# Patient Record
Sex: Male | Born: 1990
Health system: Southern US, Community
[De-identification: ages and names within clinical notes are randomized; demographics above are authoritative.]

## PROBLEM LIST (undated history)

## (undated) ENCOUNTER — Emergency Department (HOSPITAL_COMMUNITY): Payer: Medicaid Other

## (undated) DIAGNOSIS — E119 Type 2 diabetes mellitus without complications: Secondary | ICD-10-CM

## (undated) HISTORY — PX: EYE SURGERY: SHX253

## (undated) HISTORY — PX: OTHER SURGICAL HISTORY: SHX169

## (undated) HISTORY — PX: TENDON REPAIR: SHX5111

---

## 2018-12-10 ENCOUNTER — Other Ambulatory Visit: Payer: Self-pay

## 2018-12-10 ENCOUNTER — Inpatient Hospital Stay (HOSPITAL_COMMUNITY)
Admission: EM | Admit: 2018-12-10 | Discharge: 2018-12-12 | DRG: 638 | Disposition: A | Discharge status: Home / Self Care | Payer: Medicaid Other | Attending: Internal Medicine | Admitting: Internal Medicine

## 2018-12-10 ENCOUNTER — Encounter (HOSPITAL_COMMUNITY): Payer: Self-pay | Admitting: Emergency Medicine

## 2018-12-10 ENCOUNTER — Emergency Department (HOSPITAL_COMMUNITY)
Admission: EM | Admit: 2018-12-10 | Discharge: 2018-12-10 | Discharge status: Against Medical Advice | Payer: Self-pay | Attending: Emergency Medicine | Admitting: Emergency Medicine

## 2018-12-10 ENCOUNTER — Encounter (HOSPITAL_COMMUNITY): Payer: Self-pay | Admitting: Student

## 2018-12-10 DIAGNOSIS — E1110 Type 2 diabetes mellitus with ketoacidosis without coma: Secondary | ICD-10-CM | POA: Diagnosis present

## 2018-12-10 DIAGNOSIS — E860 Dehydration: Secondary | ICD-10-CM | POA: Diagnosis present

## 2018-12-10 DIAGNOSIS — F1010 Alcohol abuse, uncomplicated: Secondary | ICD-10-CM | POA: Diagnosis present

## 2018-12-10 DIAGNOSIS — Z87891 Personal history of nicotine dependence: Secondary | ICD-10-CM

## 2018-12-10 DIAGNOSIS — Z5321 Procedure and treatment not carried out due to patient leaving prior to being seen by health care provider: Secondary | ICD-10-CM | POA: Insufficient documentation

## 2018-12-10 DIAGNOSIS — N179 Acute kidney failure, unspecified: Secondary | ICD-10-CM | POA: Diagnosis present

## 2018-12-10 DIAGNOSIS — R112 Nausea with vomiting, unspecified: Secondary | ICD-10-CM | POA: Diagnosis present

## 2018-12-10 DIAGNOSIS — R7989 Other specified abnormal findings of blood chemistry: Secondary | ICD-10-CM | POA: Diagnosis present

## 2018-12-10 DIAGNOSIS — E871 Hypo-osmolality and hyponatremia: Secondary | ICD-10-CM | POA: Diagnosis present

## 2018-12-10 DIAGNOSIS — E876 Hypokalemia: Secondary | ICD-10-CM | POA: Diagnosis present

## 2018-12-10 DIAGNOSIS — F1290 Cannabis use, unspecified, uncomplicated: Secondary | ICD-10-CM | POA: Diagnosis present

## 2018-12-10 DIAGNOSIS — I959 Hypotension, unspecified: Secondary | ICD-10-CM | POA: Diagnosis present

## 2018-12-10 DIAGNOSIS — Z20828 Contact with and (suspected) exposure to other viral communicable diseases: Secondary | ICD-10-CM | POA: Diagnosis present

## 2018-12-10 DIAGNOSIS — R109 Unspecified abdominal pain: Secondary | ICD-10-CM

## 2018-12-10 DIAGNOSIS — E1010 Type 1 diabetes mellitus with ketoacidosis without coma: Secondary | ICD-10-CM

## 2018-12-10 DIAGNOSIS — Z560 Unemployment, unspecified: Secondary | ICD-10-CM

## 2018-12-10 LAB — CBC
HCT: 41.2 % (ref 39.0–52.0)
HCT: 41.1 % (ref 39.0–52.0)
Hemoglobin: 14.5 g/dL (ref 13.0–17.0)
Hemoglobin: 14.7 g/dL (ref 13.0–17.0)
MCH: 34.1 pg — ABNORMAL HIGH (ref 26.0–34.0)
MCH: 36.2 pg — ABNORMAL HIGH (ref 26.0–34.0)
MCHC: 35.2 g/dL (ref 30.0–36.0)
MCHC: 35.8 g/dL (ref 30.0–36.0)
MCV: 96.9 fL (ref 80.0–100.0)
MCV: 101.2 fL — ABNORMAL HIGH (ref 80.0–100.0)
Platelets: 377 10*3/uL (ref 150–400)
Platelets: 378 10*3/uL (ref 150–400)
RBC: 4.25 MIL/uL (ref 4.22–5.81)
RBC: 4.06 MIL/uL — ABNORMAL LOW (ref 4.22–5.81)
RDW: 11.9 % (ref 11.5–15.5)
RDW: 12 % (ref 11.5–15.5)
WBC: 7.5 10*3/uL (ref 4.0–10.5)
WBC: 6.4 10*3/uL (ref 4.0–10.5)
nRBC: 0 % (ref 0.0–0.2)
nRBC: 0 % (ref 0.0–0.2)

## 2018-12-10 LAB — COMPREHENSIVE METABOLIC PANEL
ALT: 53 U/L — ABNORMAL HIGH (ref 0–44)
AST: 54 U/L — ABNORMAL HIGH (ref 15–41)
Albumin: 3.8 g/dL (ref 3.5–5.0)
Alkaline Phosphatase: 175 U/L — ABNORMAL HIGH (ref 38–126)
Anion gap: 19 — ABNORMAL HIGH (ref 5–15)
BUN: 14 mg/dL (ref 6–20)
CO2: 22 mmol/L (ref 22–32)
Calcium: 8.9 mg/dL (ref 8.9–10.3)
Chloride: 81 mmol/L — ABNORMAL LOW (ref 98–111)
Creatinine, Ser: 1.25 mg/dL — ABNORMAL HIGH (ref 0.61–1.24)
GFR calc Af Amer: >60 mL/min (ref >60)
GFR calc non Af Amer: >60 mL/min (ref >60)
Glucose, Bld: 844 mg/dL (ref 70–99)
Potassium: 4.3 mmol/L (ref 3.5–5.1)
Sodium: 122 mmol/L — ABNORMAL LOW (ref 135–145)
Total Bilirubin: 1.5 mg/dL — ABNORMAL HIGH (ref 0.3–1.2)
Total Protein: 6.3 g/dL — ABNORMAL LOW (ref 6.5–8.1)

## 2018-12-10 LAB — URINALYSIS, ROUTINE W REFLEX MICROSCOPIC
Bacteria, UA: NONE SEEN
Bilirubin Urine: NEGATIVE
Bilirubin Urine: NEGATIVE
Glucose, UA: >=500 mg/dL — AB
Glucose, UA: >=500 mg/dL — AB
Hgb urine dipstick: NEGATIVE
Hgb urine dipstick: NEGATIVE
Ketones, ur: 80 mg/dL — AB
Ketones, ur: 20 mg/dL — AB
Leukocytes,Ua: NEGATIVE
Leukocytes,Ua: NEGATIVE
Nitrite: NEGATIVE
Nitrite: NEGATIVE
Protein, ur: NEGATIVE mg/dL
Protein, ur: NEGATIVE mg/dL
Specific Gravity, Urine: 1.026 (ref 1.005–1.030)
Specific Gravity, Urine: 1.026 (ref 1.005–1.030)
pH: 6 (ref 5.0–8.0)
pH: 6 (ref 5.0–8.0)

## 2018-12-10 LAB — LIPASE, BLOOD: Lipase: 45 U/L (ref 11–51)

## 2018-12-10 LAB — RAPID URINE DRUG SCREEN, HOSP PERFORMED
Amphetamines: NOT DETECTED
Barbiturates: NOT DETECTED
Benzodiazepines: NOT DETECTED
Cocaine: NOT DETECTED
Opiates: NOT DETECTED
Tetrahydrocannabinol: NOT DETECTED

## 2018-12-10 LAB — BASIC METABOLIC PANEL
Anion gap: 20 — ABNORMAL HIGH (ref 5–15)
BUN: 17 mg/dL (ref 6–20)
CO2: 20 mmol/L — ABNORMAL LOW (ref 22–32)
Calcium: 8.8 mg/dL — ABNORMAL LOW (ref 8.9–10.3)
Chloride: 79 mmol/L — ABNORMAL LOW (ref 98–111)
Creatinine, Ser: 1.2 mg/dL (ref 0.61–1.24)
GFR calc Af Amer: >60 mL/min (ref >60)
GFR calc non Af Amer: >60 mL/min (ref >60)
Glucose, Bld: 976 mg/dL (ref 70–99)
Potassium: 4.7 mmol/L (ref 3.5–5.1)
Sodium: 119 mmol/L — CL (ref 135–145)

## 2018-12-10 LAB — CBG MONITORING, ED: Glucose-Capillary: >600 mg/dL (ref 70–99)

## 2018-12-10 MED ORDER — SODIUM CHLORIDE 0.9% FLUSH: 3.0000 mL | Freq: Once | INTRAVENOUS | Status: DC

## 2018-12-10 NOTE — ED Triage Notes (Signed)
Pt reports chronic abdominal pain for the last several months. Pt reports muscleaches, constipation, dehydration, vomiting. Reports only 2 bowel movements in the last month.

## 2018-12-10 NOTE — ED Provider Notes (Signed)
[facility] EMERGENCY DEPARTMENT Provider Note   CSN: [id number] Arrival date & time: 12/10/18  1839     History   Chief Complaint Chief Complaint  Patient presents with  . Multiple Complaints    HPI Justin [name] is a 28 y.o. male without documented PmHx who presents to the ED with complaints of general decline in his health over the past 3 months. Patient states that over the past few months he feels he is falling apart, he reports several sxs including fatigue, generalized weakness, polydipsia, polyuria, N/V, generalized abdominal pain, & arthralgias/myalgias. He notes approximately 50 pound weight loss within 3 months. Sxs occur daily, progressively worsening. No specific alleviating/aggravating factors. He thought this my have been related to EtOH abuse as he was drinking 6-12 beers daily, however he tapered this down and is no longer drinking any alcohol. He has tried adjusting his diet without much change either. He was fired from his job. He states he came in tonight for treatment because he just got a new job that he wants to be able to keep. Denies fever, chest pain, dyspnea, hematemesis, melena, or rash. Denies prior medical problems, he has not seen a PCP previously. Reports occasional marijuana use, no other drug use.      HPI  No past medical history on file.  There are no active problems to display for this patient.   History reviewed. No pertinent surgical history.      Home Medications    Prior to Admission medications   Not on File    Family History No family history on file.  Social History Social History   Tobacco Use  . Smoking status: Former Smoker  Substance Use Topics  . Alcohol use: Not Currently  . Drug use: Yes    Types: Marijuana     Allergies   Patient has no known allergies.   Review of Systems Review of Systems  Constitutional: Positive for fatigue. Negative for chills and fever.  HENT: Negative for  congestion, ear pain and sore throat.   Respiratory: Negative for cough and shortness of breath.   Cardiovascular: Negative for chest pain.  Gastrointestinal: Positive for abdominal pain, nausea and vomiting. Negative for blood in stool and diarrhea.  Endocrine: Positive for polydipsia, polyphagia and polyuria.  Musculoskeletal: Positive for arthralgias and myalgias.  Neurological: Positive for weakness (generalized). Negative for syncope.  All other systems reviewed and are negative.    Physical Exam Updated Vital Signs BP (!) 93/59 (BP Location: Left Arm)   Pulse 64   Temp 98.2 F (36.8 C) (Oral)   Resp 20   SpO2 100%   Physical Exam Vitals signs and nursing note reviewed.  Constitutional:      General: He is not in acute distress.    Appearance: He is well-developed. He is not toxic-appearing.  HENT:     Head: Normocephalic and atraumatic.  Eyes:     General:        Right eye: No discharge.        Left eye: No discharge.     Conjunctiva/sclera: Conjunctivae normal.  Neck:     Musculoskeletal: Neck supple.  Cardiovascular:     Rate and Rhythm: Normal rate and regular rhythm.  Pulmonary:     Effort: Pulmonary effort is normal. No respiratory distress.     Breath sounds: Normal breath sounds. No wheezing, rhonchi or rales.  Abdominal:     General: There is no distension.     Palpations:  Abdomen is soft.     Tenderness: There is no abdominal tenderness. There is no guarding or rebound.  Musculoskeletal:     Comments: Mild edema noted to the bilateral ankles w/o overlying erythema/warmth. Intact AROM to extremities x 4. No point/focal tenderness. Compartments are soft.   Skin:    General: Skin is warm and dry.     Findings: No rash.  Neurological:     Mental Status: He is alert.     Comments: Clear speech.   Psychiatric:        Behavior: Behavior normal.    ED Treatments / Results  Labs (all labs ordered are listed, but only abnormal results are displayed) Labs  Reviewed  BASIC METABOLIC PANEL - Abnormal; Notable for the following components:      Result Value   Sodium 119 (*)    Chloride 79 (*)    CO2 20 (*)    Glucose, Bld 976 (*)    Calcium 8.8 (*)    Anion gap 20 (*)    All other components within normal limits  CBC - Abnormal; Notable for the following components:   RBC 4.06 (*)    MCV 101.2 (*)    MCH 36.2 (*)    All other components within normal limits  URINALYSIS, ROUTINE W REFLEX MICROSCOPIC - Abnormal; Notable for the following components:   Color, Urine COLORLESS (*)    Glucose, UA >=500 (*)    Ketones, ur 20 (*)    All other components within normal limits  CBG MONITORING, ED - Abnormal; Notable for the following components:   Glucose-Capillary >600 (*)    All other components within normal limits  CBG MONITORING, ED    EKG None  Radiology No results found.  Procedures .Critical Care Performed by: Cherly AndersonPetrucelli, Laverle Pillard R, PA-C Authorized by: Cherly AndersonPetrucelli, Ben Sanz R, PA-C    CRITICAL CARE Performed by: Harvie HeckSamantha Jesyca Weisenburger   Total critical care time: 30 minutes  Critical care time was exclusive of separately billable procedures and treating other patients.  Critical care was necessary to treat or prevent imminent or life-threatening deterioration.  Critical care was time spent personally by me on the following activities: development of treatment plan with patient and/or surrogate as well as nursing, discussions with consultants, evaluation of patient's response to treatment, examination of patient, obtaining history from patient or surrogate, ordering and performing treatments and interventions, ordering and review of laboratory studies, ordering and review of radiographic studies, pulse oximetry and re-evaluation of patient's condition.    (including critical care time)  Medications Ordered in ED Medications  insulin regular, human (MYXREDLIN) 100 units/ 100 mL infusion (has no administration in time range)   sodium chloride 0.9 % bolus 1,000 mL (1,000 mLs Intravenous New Bag/Given 12/11/18 0053)    And  sodium chloride 0.9 % bolus 1,000 mL (has no administration in time range)    And  0.9 %  sodium chloride infusion (has no administration in time range)  dextrose 5 %-0.45 % sodium chloride infusion (has no administration in time range)  potassium chloride 10 mEq in 100 mL IVPB (10 mEq Intravenous New Bag/Given 12/11/18 0058)     Initial Impression / Assessment and Plan / ED Course  I have reviewed the triage vital signs and the nursing notes.  Pertinent labs & imaging results that were available during my care of the patient were reviewed by me and considered in my medical decision making (see chart for details).   Patient without known pmhx  presents to the ED w/ progressive health decline x 3 months. He is nontoxic appearing, vitals w/ soft pressures otherwise WNL. Exam fairly benign.   Labs per triage:  CBC: No significant leukocytosis or substantial anemia BMP: Marked hyperglycemia with glucose of 976, acidosis w/ bicarb of 20, & elevated anion gap @ 20. Hyponatremia @ 119, corrected sodium Katx 133, hillier 140 UA: No UTI. Glucosuria. Ketonuria.   Concern for new onset DM.  2L NS ordered.  Start insulin.  Will consult hospitalist service for admission.   01:10: CONSULT: Discussed w/ hospitalist Dr. Toniann Fail- accepts admission.   Findings and plan of care discussed with supervising physician Dr. Clayborne Dana who is in agreement.   Final Clinical Impressions(s) / ED Diagnoses   Final diagnoses:  Diabetic ketoacidosis without coma associated with type 1 diabetes mellitus [facility])    ED Discharge Orders    None       Cherly Anderson, PA-C 12/11/18 0111    Mesner, Barbara Cower, MD 12/11/18 [phone]

## 2018-12-10 NOTE — ED Notes (Signed)
Pt has LWBS @ 1455. :ab called @ 1541 to inform of critical glucose of 800s. Attempting to contact pt.

## 2018-12-10 NOTE — ED Triage Notes (Addendum)
Pt came to the ED earlier and was told his blood sugar was high but had to leave and is now back for treatment. Also c/o losing weight, leg cramps, leg swelling, etc.

## 2018-12-10 NOTE — ED Notes (Signed)
Pt did not answer when called to inform him to come back to ED. Voicemail left.

## 2018-12-11 ENCOUNTER — Encounter (HOSPITAL_COMMUNITY): Payer: Self-pay | Admitting: Internal Medicine

## 2018-12-11 ENCOUNTER — Inpatient Hospital Stay (HOSPITAL_COMMUNITY): Payer: Medicaid Other

## 2018-12-11 DIAGNOSIS — E1010 Type 1 diabetes mellitus with ketoacidosis without coma: Principal | ICD-10-CM

## 2018-12-11 DIAGNOSIS — R112 Nausea with vomiting, unspecified: Secondary | ICD-10-CM | POA: Diagnosis present

## 2018-12-11 DIAGNOSIS — R7989 Other specified abnormal findings of blood chemistry: Secondary | ICD-10-CM | POA: Diagnosis present

## 2018-12-11 DIAGNOSIS — E871 Hypo-osmolality and hyponatremia: Secondary | ICD-10-CM | POA: Diagnosis present

## 2018-12-11 DIAGNOSIS — E1110 Type 2 diabetes mellitus with ketoacidosis without coma: Secondary | ICD-10-CM | POA: Diagnosis present

## 2018-12-11 LAB — PHOSPHORUS: Phosphorus: 1.6 mg/dL — ABNORMAL LOW (ref 2.5–4.6)

## 2018-12-11 LAB — BASIC METABOLIC PANEL
Anion gap: 18 — ABNORMAL HIGH (ref 5–15)
Anion gap: 16 — ABNORMAL HIGH (ref 5–15)
Anion gap: 14 (ref 5–15)
BUN: 9 mg/dL (ref 6–20)
BUN: 11 mg/dL (ref 6–20)
BUN: 8 mg/dL (ref 6–20)
CO2: 17 mmol/L — ABNORMAL LOW (ref 22–32)
CO2: 19 mmol/L — ABNORMAL LOW (ref 22–32)
CO2: 21 mmol/L — ABNORMAL LOW (ref 22–32)
Calcium: 7.8 mg/dL — ABNORMAL LOW (ref 8.9–10.3)
Calcium: 8 mg/dL — ABNORMAL LOW (ref 8.9–10.3)
Calcium: 7.7 mg/dL — ABNORMAL LOW (ref 8.9–10.3)
Chloride: 97 mmol/L — ABNORMAL LOW (ref 98–111)
Chloride: 99 mmol/L (ref 98–111)
Chloride: 98 mmol/L (ref 98–111)
Creatinine, Ser: 0.79 mg/dL (ref 0.61–1.24)
Creatinine, Ser: 0.58 mg/dL — ABNORMAL LOW (ref 0.61–1.24)
Creatinine, Ser: 0.58 mg/dL — ABNORMAL LOW (ref 0.61–1.24)
GFR calc Af Amer: >60 mL/min (ref >60)
GFR calc Af Amer: >60 mL/min (ref >60)
GFR calc Af Amer: >60 mL/min (ref >60)
GFR calc non Af Amer: >60 mL/min (ref >60)
GFR calc non Af Amer: >60 mL/min (ref >60)
GFR calc non Af Amer: >60 mL/min (ref >60)
Glucose, Bld: 347 mg/dL — ABNORMAL HIGH (ref 70–99)
Glucose, Bld: 246 mg/dL — ABNORMAL HIGH (ref 70–99)
Glucose, Bld: 199 mg/dL — ABNORMAL HIGH (ref 70–99)
Potassium: 3.3 mmol/L — ABNORMAL LOW (ref 3.5–5.1)
Potassium: 3.1 mmol/L — ABNORMAL LOW (ref 3.5–5.1)
Potassium: 2.9 mmol/L — ABNORMAL LOW (ref 3.5–5.1)
Sodium: 132 mmol/L — ABNORMAL LOW (ref 135–145)
Sodium: 134 mmol/L — ABNORMAL LOW (ref 135–145)
Sodium: 133 mmol/L — ABNORMAL LOW (ref 135–145)

## 2018-12-11 LAB — TROPONIN I (HIGH SENSITIVITY): Troponin I (High Sensitivity): 6 ng/L (ref <18)

## 2018-12-11 LAB — CBC WITH DIFFERENTIAL/PLATELET
Abs Immature Granulocytes: 0.07 10*3/uL (ref 0.00–0.07)
Basophils Absolute: 0.1 10*3/uL (ref 0.0–0.1)
Basophils Relative: 1 %
Eosinophils Absolute: 0.2 10*3/uL (ref 0.0–0.5)
Eosinophils Relative: 3 %
HCT: 34.1 % — ABNORMAL LOW (ref 39.0–52.0)
Hemoglobin: 12.7 g/dL — ABNORMAL LOW (ref 13.0–17.0)
Immature Granulocytes: 1 %
Lymphocytes Relative: 34 %
Lymphs Abs: 1.9 10*3/uL (ref 0.7–4.0)
MCH: 35 pg — ABNORMAL HIGH (ref 26.0–34.0)
MCHC: 37.2 g/dL — ABNORMAL HIGH (ref 30.0–36.0)
MCV: 93.9 fL (ref 80.0–100.0)
Monocytes Absolute: 0.3 10*3/uL (ref 0.1–1.0)
Monocytes Relative: 6 %
Neutro Abs: 3.1 10*3/uL (ref 1.7–7.7)
Neutrophils Relative %: 55 %
Platelets: 238 10*3/uL (ref 150–400)
RBC: 3.63 MIL/uL — ABNORMAL LOW (ref 4.22–5.81)
RDW: 11.8 % (ref 11.5–15.5)
WBC: 5.7 10*3/uL (ref 4.0–10.5)
nRBC: 1.6 % — ABNORMAL HIGH (ref 0.0–0.2)

## 2018-12-11 LAB — GLUCOSE, CAPILLARY
Glucose-Capillary: 299 mg/dL — ABNORMAL HIGH (ref 70–99)
Glucose-Capillary: 225 mg/dL — ABNORMAL HIGH (ref 70–99)
Glucose-Capillary: 183 mg/dL — ABNORMAL HIGH (ref 70–99)
Glucose-Capillary: 210 mg/dL — ABNORMAL HIGH (ref 70–99)
Glucose-Capillary: 194 mg/dL — ABNORMAL HIGH (ref 70–99)
Glucose-Capillary: 316 mg/dL — ABNORMAL HIGH (ref 70–99)
Glucose-Capillary: 247 mg/dL — ABNORMAL HIGH (ref 70–99)
Glucose-Capillary: 197 mg/dL — ABNORMAL HIGH (ref 70–99)
Glucose-Capillary: 161 mg/dL — ABNORMAL HIGH (ref 70–99)
Glucose-Capillary: 362 mg/dL — ABNORMAL HIGH (ref 70–99)
Glucose-Capillary: 425 mg/dL — ABNORMAL HIGH (ref 70–99)
Glucose-Capillary: 399 mg/dL — ABNORMAL HIGH (ref 70–99)
Glucose-Capillary: 261 mg/dL — ABNORMAL HIGH (ref 70–99)

## 2018-12-11 LAB — MRSA PCR SCREENING: MRSA by PCR: NEGATIVE

## 2018-12-11 LAB — RAPID URINE DRUG SCREEN, HOSP PERFORMED
Amphetamines: NOT DETECTED
Barbiturates: NOT DETECTED
Benzodiazepines: NOT DETECTED
Cocaine: NOT DETECTED
Opiates: NOT DETECTED
Tetrahydrocannabinol: NOT DETECTED

## 2018-12-11 LAB — CBG MONITORING, ED
Glucose-Capillary: 458 mg/dL — ABNORMAL HIGH (ref 70–99)
Glucose-Capillary: 383 mg/dL — ABNORMAL HIGH (ref 70–99)

## 2018-12-11 LAB — SARS CORONAVIRUS 2 (TAT 6-24 HRS): SARS Coronavirus 2: NEGATIVE

## 2018-12-11 LAB — HIV ANTIBODY (ROUTINE TESTING W REFLEX): HIV Screen 4th Generation wRfx: NONREACTIVE

## 2018-12-11 LAB — MAGNESIUM: Magnesium: 1.9 mg/dL (ref 1.7–2.4)

## 2018-12-11 MED ORDER — INSULIN ASPART 100 UNIT/ML ~~LOC~~ SOLN
20.0000 [IU] | Freq: Once | SUBCUTANEOUS | Status: AC
  Administered 2018-12-11: 20 [IU] via SUBCUTANEOUS

## 2018-12-11 MED ORDER — POTASSIUM CHLORIDE CRYS ER 20 MEQ PO TBCR
40.0000 meq | EXTENDED_RELEASE_TABLET | Freq: Once | ORAL | Status: AC
  Administered 2018-12-11: 40 meq via ORAL
  Filled 2018-12-11: qty 2

## 2018-12-11 MED ORDER — THIAMINE HCL 100 MG/ML IJ SOLN: 100.0000 mg | Freq: Every day | INTRAMUSCULAR | Status: DC

## 2018-12-11 MED ORDER — LACTATED RINGERS IV BOLUS
2000.0000 mL | Freq: Once | INTRAVENOUS | Status: AC
  Administered 2018-12-11: 2000 mL via INTRAVENOUS

## 2018-12-11 MED ORDER — SODIUM CHLORIDE 0.9 % IV BOLUS
1000.0000 mL | Freq: Once | INTRAVENOUS | Status: AC
  Administered 2018-12-11: 1000 mL via INTRAVENOUS

## 2018-12-11 MED ORDER — POTASSIUM CHLORIDE 10 MEQ/100ML IV SOLN
10.0000 meq | INTRAVENOUS | Status: AC
  Administered 2018-12-11 (×2): 10 meq via INTRAVENOUS
  Filled 2018-12-11 (×2): qty 100

## 2018-12-11 MED ORDER — LORAZEPAM 2 MG/ML IJ SOLN
1.0000 mg | INTRAMUSCULAR | Status: DC | PRN
  Administered 2018-12-11: 2 mg via INTRAVENOUS
  Filled 2018-12-11: qty 1

## 2018-12-11 MED ORDER — ENOXAPARIN SODIUM 40 MG/0.4ML ~~LOC~~ SOLN
40.0000 mg | SUBCUTANEOUS | Status: DC
  Administered 2018-12-11 – 2018-12-12 (×2): 40 mg via SUBCUTANEOUS
  Filled 2018-12-11 (×2): qty 0.4

## 2018-12-11 MED ORDER — PANTOPRAZOLE SODIUM 40 MG PO TBEC
40.0000 mg | DELAYED_RELEASE_TABLET | Freq: Every day | ORAL | Status: DC
  Administered 2018-12-11 – 2018-12-12 (×2): 40 mg via ORAL
  Filled 2018-12-11: qty 1

## 2018-12-11 MED ORDER — INSULIN GLARGINE 100 UNIT/ML ~~LOC~~ SOLN
20.0000 [IU] | Freq: Every day | SUBCUTANEOUS | Status: DC
  Administered 2018-12-11: 20 [IU] via SUBCUTANEOUS
  Filled 2018-12-11 (×2): qty 0.2

## 2018-12-11 MED ORDER — INSULIN STARTER KIT- SYRINGES (ENGLISH)
1.0000 | Freq: Once | Status: DC
  Filled 2018-12-11: qty 1

## 2018-12-11 MED ORDER — INSULIN ASPART 100 UNIT/ML ~~LOC~~ SOLN
0.0000 [IU] | Freq: Every day | SUBCUTANEOUS | Status: DC
  Administered 2018-12-11: 3 [IU] via SUBCUTANEOUS

## 2018-12-11 MED ORDER — INSULIN ASPART 100 UNIT/ML ~~LOC~~ SOLN: 0.0000 [IU] | Freq: Every day | SUBCUTANEOUS | Status: DC

## 2018-12-11 MED ORDER — LORAZEPAM 1 MG PO TABS
1.0000 mg | ORAL_TABLET | ORAL | Status: DC | PRN
  Administered 2018-12-12: 1 mg via ORAL
  Filled 2018-12-11: qty 1

## 2018-12-11 MED ORDER — INSULIN GLARGINE 100 UNIT/ML ~~LOC~~ SOLN
20.0000 [IU] | SUBCUTANEOUS | Status: AC
  Administered 2018-12-11: 20 [IU] via SUBCUTANEOUS
  Filled 2018-12-11: qty 0.2

## 2018-12-11 MED ORDER — VITAMIN B-1 100 MG PO TABS
100.0000 mg | ORAL_TABLET | Freq: Every day | ORAL | Status: DC
  Administered 2018-12-11 – 2018-12-12 (×2): 100 mg via ORAL
  Filled 2018-12-11 (×3): qty 1

## 2018-12-11 MED ORDER — SODIUM CHLORIDE 0.9 % IV SOLN: INTRAVENOUS | Status: DC

## 2018-12-11 MED ORDER — LACTATED RINGERS IV SOLN: INTRAVENOUS | Status: DC

## 2018-12-11 MED ORDER — INSULIN REGULAR(HUMAN) IN NACL 100-0.9 UT/100ML-% IV SOLN
INTRAVENOUS | Status: DC
  Administered 2018-12-11: 08:00:00 6 [IU]/h via INTRAVENOUS
  Filled 2018-12-11: qty 100

## 2018-12-11 MED ORDER — ADULT MULTIVITAMIN W/MINERALS CH
1.0000 | ORAL_TABLET | Freq: Every day | ORAL | Status: DC
  Administered 2018-12-11 – 2018-12-12 (×2): 1 via ORAL
  Filled 2018-12-11 (×2): qty 1

## 2018-12-11 MED ORDER — DEXTROSE-NACL 5-0.45 % IV SOLN
INTRAVENOUS | Status: DC
  Administered 2018-12-11: 06:00:00 via INTRAVENOUS

## 2018-12-11 MED ORDER — LIVING WELL WITH DIABETES BOOK
Freq: Once | Status: AC
  Administered 2018-12-11: 13:00:00
  Filled 2018-12-11: qty 1

## 2018-12-11 MED ORDER — DEXTROSE-NACL 5-0.45 % IV SOLN: INTRAVENOUS | Status: DC

## 2018-12-11 MED ORDER — INSULIN REGULAR(HUMAN) IN NACL 100-0.9 UT/100ML-% IV SOLN
INTRAVENOUS | Status: DC
  Administered 2018-12-11: 4 [IU]/h via INTRAVENOUS
  Filled 2018-12-11: qty 100

## 2018-12-11 MED ORDER — INSULIN ASPART 100 UNIT/ML ~~LOC~~ SOLN
0.0000 [IU] | Freq: Three times a day (TID) | SUBCUTANEOUS | Status: DC
  Administered 2018-12-11: 9 [IU] via SUBCUTANEOUS

## 2018-12-11 MED ORDER — INSULIN ASPART 100 UNIT/ML ~~LOC~~ SOLN
0.0000 [IU] | Freq: Three times a day (TID) | SUBCUTANEOUS | Status: DC
  Administered 2018-12-12: 15 [IU] via SUBCUTANEOUS
  Administered 2018-12-12: 20 [IU] via SUBCUTANEOUS

## 2018-12-11 MED ORDER — LACTATED RINGERS IV SOLN
INTRAVENOUS | Status: AC
  Administered 2018-12-11: 15:00:00 via INTRAVENOUS

## 2018-12-11 MED ORDER — PANTOPRAZOLE SODIUM 40 MG IV SOLR
40.0000 mg | Freq: Every day | INTRAVENOUS | Status: DC
  Administered 2018-12-11 (×2): 40 mg via INTRAVENOUS
  Filled 2018-12-11 (×2): qty 40

## 2018-12-11 MED ORDER — FOLIC ACID 1 MG PO TABS
1.0000 mg | ORAL_TABLET | Freq: Every day | ORAL | Status: DC
  Administered 2018-12-11 – 2018-12-12 (×2): 1 mg via ORAL
  Filled 2018-12-11 (×2): qty 1

## 2018-12-11 NOTE — ED Provider Notes (Signed)
Medical screening examination/treatment/procedure(s) were conducted as a shared visit with non-physician practitioner(s) and myself.  I personally evaluated the patient during the encounter.  Here with what appears to be new onset DKA likely after a tooth infection a few months ago.  Patient with unknown family history.  No previous diagnosis.  Will start on insulin, fluids admit to hospitalist.  EKG Interpretation  Date/Time:  Saturday December 11 2018 00:44:57 EDT Ventricular Rate:  69 PR Interval:    QRS Duration: 105 QT Interval:  429 QTC Calculation: 460 R Axis:   80 Text Interpretation:  Sinus rhythm No old tracing to compare Confirmed by Merrily Pew [phone]) on 12/11/2018 3:35:50 AM      Samuel Mcpeek, Corene Cornea, MD 12/11/18 [phone]

## 2018-12-11 NOTE — Progress Notes (Signed)
Inpatient Diabetes Program Recommendations  AACE/ADA: New Consensus Statement on Inpatient Glycemic Control (2015)  Target Ranges:  Prepandial:   less than 140 mg/dL      Peak postprandial:   less than 180 mg/dL (1-2 hours)      Critically ill patients:  140 - 180 mg/dL   Lab Results  Component Value Date   GLUCAP 316 (H) 12/11/2018    Review of Glycemic Control  Diabetes history: New onset DM Outpatient Diabetes medications: None Current orders for Inpatient glycemic control: IV insulin to transition to Lantus 20 units daily  Inpatient Diabetes Program Recommendations:   When ready to transition from IV insulin, please give Lantus insulin 2 hrs prior to discontinuance of IV insulin and cover last CBG with Novolog correction. Secure chatted with RN Cloretta Ned and reviewed plan of care with him. Ordered Living Well with Diabetes book, starter kit with syringes, consult to dietician and case management. Patient may be able to afford less expensive Novolin insulin for approximately $25 per vial. Will follow during hospitalization.  Thank you, Nani Gasser. Shizuo Biskup, RN, MSN, CDE  Diabetes Coordinator Inpatient Glycemic Control Team Team Pager [phone] (8am-5pm) 12/11/2018 10:24 AM

## 2018-12-11 NOTE — Progress Notes (Signed)
Leith Szafranski [id number] Admitted to 5W11: 12/11/2018 5:41 AM Attending Provider: Rise Patience, MD    Justin [name] is a 28 y.o. male patient admitted from ED awake, alert  & orientated  X 3,  Full Code, VSS - Blood pressure (!) 89/55, pulse 65, temperature 98.2 F (36.8 C), temperature source Oral, resp. rate 15, SpO2 98 %.RA, no c/o shortness of breath, no c/o chest pain, no distress noted. Tele # [id number] placed and pt is currently running: SR; 2 boluses ordered and will be given.   IV site WDL:   with a transparent dsg that's clean dry and intact.  Allergies:  No Known Allergies   History reviewed. No pertinent past medical history.  History:  obtained from patient  Pt orientation to unit, room and routine. Information packet given to patient.  Admission INP armband ID verified with patient, and in place. SR up x 2, fall risk assessment complete with Patient verbalizing understanding of risks associated with falls. Pt verbalizes an understanding of how to use the call bell and to call for help before getting out of bed.  Skin, clean-dry- intact without evidence of bruising, or skin tears.   No evidence of skin break down noted on exam.   Will cont to monitor and assist as needed.  Parthenia Ames, RN 12/11/2018 5:41 AM

## 2018-12-11 NOTE — Progress Notes (Addendum)
PROGRESS NOTE                                                                                                                                                                                                             Patient Demographics:    Justin [name], is a 28 y.o. male, DOB - Jan 29, 1991, WPV:[id number]  Admit date - 12/10/2018   Admitting Physician Rise Patience, MD  Outpatient Primary MD for the patient is Patient, No Pcp Per  LOS - 0  Chief Complaint  Patient presents with  . Multiple Complaints       Brief Narrative   Justin [name] is a 28 y.o. male with no significant past medical history presents to the ER with complaints of neurolyse weakness body aches nausea vomiting abdominal pain for the last few days.  Has been having polyuria polydipsia.  He was diagnosed with DKA, new onset DM type II admitted to the hospital   Subjective:    Justin [name] today has, No headache, No chest pain, No abdominal pain - No Nausea, No new weakness tingling or numbness, No Cough - SOB. Feels fatigued.   Assessment  & Plan :     1.  DKA in a newly diagnosed DM type II.  Has been treated with aggressive IV fluids, IV insulin, Lantus was given earlier this morning, gap seems to have closed, will try and transition him off of IV insulin drip, sliding scale, diabetic and insulin education.  Monitor electrolytes.  No results found for: HGBA1C  CBG (last 3)  Recent Labs    12/11/18 0935 12/11/18 1057 12/11/18 1200  GLUCAP 316* 247* 197*     2.  Severe dehydration with AKI.  Aggressively hydrate with IV fluids.  AKI has resolved.  3.  Hypokalemia.  Replace.  Will monitor.  4.  History of alcohol abuse, has not drank alcohol in 1 week.  CIWA protocol, should not go in DTs if truly off of alcohol for a week, will monitor.  Has been counseled.  5.  Elevated LFTs.  Could be due to #4.  Will trend.  Symptom-free currently no abdominal pain nausea or vomiting.  6.   Epigastric pain nausea vomiting upon admission.  Likely due to DKA.  PPI and supportive care.  Now tolerating diet.  Will monitor.    Family Communication  :  None  Code Status :  Full  Disposition Plan  :  Home 1-2 days  Consults  :  None  Procedures  :    DVT Prophylaxis  :  Lovenox added  Lab Results  Component Value Date   PLT 238 12/11/2018    Diet :  Diet Order            Diet Carb Modified Fluid consistency: Thin; Room service appropriate? Yes  Diet effective now               Inpatient Medications Scheduled Meds: . insulin aspart  0-5 Units Subcutaneous QHS  . insulin aspart  0-9 Units Subcutaneous TID WC  . insulin glargine  20 Units Subcutaneous Daily  . insulin starter kit- syringes  1 kit Other Once  . pantoprazole (PROTONIX) IV  40 mg Intravenous Daily  . potassium chloride  40 mEq Oral Once   Continuous Infusions: . lactated ringers     PRN Meds:.  Antibiotics  :   Anti-infectives (From admission, onward)   None          Objective:   Vitals:   12/11/18 0358 12/11/18 0528 12/11/18 0846 12/11/18 1235  BP: (!) 88/53 (!) 89/55 97/61 103/66  Pulse: 64 65 76 79  Resp: 14 15 17 17  Temp: 98.7 F (37.1 C) 98.2 F (36.8 C) 97.9 F (36.6 C) (!) 97.5 F (36.4 C)  TempSrc: Oral Oral    SpO2: 98% 98% (!) 86% 100%    Wt Readings from Last 3 Encounters:  12/10/18 61.2 kg     Intake/Output Summary (Last 24 hours) at 12/11/2018 1328 Last data filed at 12/11/2018 0256 Gross per 24 hour  Intake 2200 ml  Output -  Net 2200 ml     Physical Exam  Awake Alert,   No new F.N deficits, Normal affect [place].AT,PERRAL Supple Neck,No JVD, No cervical lymphadenopathy appriciated.  Symmetrical Chest wall movement, Good air movement bilaterally, CTAB RRR,No Gallops,Rubs or new Murmurs, No Parasternal Heave +ve B.Sounds, Abd Soft, No tenderness, No organomegaly appriciated, No rebound - guarding or rigidity. No Cyanosis, Clubbing or edema, No new Rash  or bruise       Data Review:    CBC Recent Labs  Lab 12/10/18 1428 12/10/18 2039 12/11/18 0454  WBC 7.5 6.4 5.7  HGB 14.5 14.7 12.7*  HCT 41.2 41.1 34.1*  PLT 377 378 238  MCV 96.9 101.2* 93.9  MCH 34.1* 36.2* 35.0*  MCHC 35.2 35.8 37.2*  RDW 11.9 12.0 11.8  LYMPHSABS  --   --  1.9  MONOABS  --   --  0.3  EOSABS  --   --  0.2  BASOSABS  --   --  0.1    Chemistries  Recent Labs  Lab 12/10/18 1428 12/10/18 2039 12/11/18 0256 12/11/18 0454 12/11/18 0744  NA 122* 119* 132* 134* 133*  K 4.3 4.7 3.3* 3.1* 2.9*  CL 81* 79* 97* 99 98  CO2 22 20* 17* 19* 21*  GLUCOSE 844* 976* 347* 246* 199*  BUN 14 17 9 11 8  CREATININE 1.25* 1.20 0.79 0.58* 0.58*  CALCIUM 8.9 8.8* 7.8* 8.0* 7.7*  MG  --   --   --   --  1.9  AST 54*  --   --   --   --   [name] 53*  --   --   --   --   ALKPHOS 175*  --   --   --   --   BILITOT 1.5*  --   --   --   --    ------------------------------------------------------------------------------------------------------------------   No results for input(s): CHOL, HDL, LDLCALC, TRIG, CHOLHDL, LDLDIRECT in the last 72 hours.  No results found for: HGBA1C ------------------------------------------------------------------------------------------------------------------ No results for input(s): TSH, T4TOTAL, T3FREE, THYROIDAB in the last 72 hours.  Invalid input(s): FREET3 ------------------------------------------------------------------------------------------------------------------ No results for input(s): VITAMINB12, FOLATE, FERRITIN, TIBC, IRON, RETICCTPCT in the last 72 hours.  Coagulation profile No results for input(s): INR, PROTIME in the last 168 hours.  No results for input(s): DDIMER in the last 72 hours.  Cardiac Enzymes No results for input(s): CKMB, TROPONINI, MYOGLOBIN in the last 168 hours.  Invalid input(s): CK ------------------------------------------------------------------------------------------------------------------ No  results found for: BNP  Micro Results Recent Results (from the past 240 hour(s))  SARS CORONAVIRUS 2 (TAT 6-24 HRS) Nasopharyngeal Nasopharyngeal Swab     Status: None   Collection Time: 12/11/18 12:49 AM   Specimen: Nasopharyngeal Swab  Result Value Ref Range Status   SARS Coronavirus 2 NEGATIVE NEGATIVE Final    Comment: (NOTE) SARS-CoV-2 target nucleic acids are NOT DETECTED. The SARS-CoV-2 RNA is generally detectable in upper and lower respiratory specimens during the acute phase of infection. Negative results do not preclude SARS-CoV-2 infection, do not rule out co-infections with other pathogens, and should not be used as the sole basis for treatment or other patient management decisions. Negative results must be combined with clinical observations, patient history, and epidemiological information. The expected result is Negative. Fact Sheet for Patients: https://www.fda.gov/media/138098/download Fact Sheet for Healthcare Providers: https://www.fda.gov/media/138095/download This test is not yet approved or cleared by the [place] FDA and  has been authorized for detection and/or diagnosis of SARS-CoV-2 by FDA under an Emergency Use Authorization (EUA). This EUA will remain  in effect (meaning this test can be used) for the duration of the COVID-19 declaration under Section 56 4(b)(1) of the Act, 21 U.S.C. section 360bbb-3(b)(1), unless the authorization is terminated or revoked sooner. Performed at [facility], [address] [place], [place] [address]   MRSA PCR Screening     Status: None   Collection Time: 12/11/18  4:13 AM   Specimen: Nasal Mucosa; Nasopharyngeal  Result Value Ref Range Status   MRSA by PCR NEGATIVE NEGATIVE Final    Comment:        The GeneXpert MRSA Assay (FDA approved for NASAL specimens only), is one component of a comprehensive MRSA colonization surveillance program. It is not intended to diagnose MRSA infection nor to guide or  monitor treatment for MRSA infections. Performed at [facility], [address] [place], [place] [address]     Radiology Reports Dg Abd Acute 2+v W 1v Chest  Result Date: 12/11/2018 CLINICAL DATA:  Elevated glucose, losing weight, leg cramps and leg swelling EXAM: DG ABDOMEN ACUTE W/ 1V CHEST COMPARISON:  None. FINDINGS: There is no evidence of dilated bowel loops or free intraperitoneal air. Large volume of fecal material present throughout the colon and overlying the rectal vault. No radiopaque calculi or other significant radiographic abnormality is seen. Heart size and mediastinal contours are within normal limits. Both lungs are clear. IMPRESSION: Large volume of fecal material throughout the colon, correlate with features of constipation. No other acute abnormality in the abdomen or pelvis. No acute cardiopulmonary abnormality. Electronically Signed   By: Price  DeHay M.D.   On: 12/11/2018 02:24    Time Spent in minutes  30   Prashant Singh M.D on 12/11/2018 at 1:28 PM  To page go to [email or web address] - password [email or web address]      

## 2018-12-11 NOTE — TOC Initial Note (Signed)
Transition of Care [facility]) - Initial/Assessment Note    Patient Details  Name: Justin [name] MRN: [id number] Date of Birth: 1990-06-03  Transition of [facility]) CM/SW Contact:    Pollie Friar, RN Phone Number: 12/11/2018, 4:47 PM  Clinical Narrative:                 Pt without a PCP and no insurance. He is interested in attending one of the [facility] and using [facility] pharmacy.  TOC will follow for either number to call on Monday or if here until Monday will obtain him an appt at one of the Clinics.  CM also following to assist with meds at d/c.   Expected Discharge Plan: Home/Self Care Barriers to Discharge: Inadequate or no insurance, Barriers Unresolved (comment), Continued Medical Work up   Patient Goals and CMS Choice        Expected Discharge Plan and Services Expected Discharge Plan: Home/Self Care   Discharge Planning Services: CM Consult                                          Prior Living Arrangements/Services     Patient language and need for interpreter reviewed:: Yes(non needs.) Do you feel safe going back to the place where you live?: Yes               Activities of Daily Living      Permission Sought/Granted                  Emotional Assessment       Orientation: : Oriented to Self, Oriented to Place, Oriented to  Time, Oriented to Situation      Admission diagnosis:  Abdominal pain [R10.9] Diabetic ketoacidosis without coma associated with type 1 diabetes mellitus ([place]) [E10.10] DKA (diabetic ketoacidoses) ([place]) [E11.10] Patient Active Problem List   Diagnosis Date Noted  . DKA (diabetic ketoacidoses) ([place]) 12/11/2018  . Elevated LFTs 12/11/2018  . Hyponatremia 12/11/2018  . Nausea & vomiting 12/11/2018   PCP:  Patient, No Pcp Per Pharmacy:  No Pharmacies Listed    Social Determinants of Health (SDOH) Interventions    Readmission Risk Interventions No flowsheet data found.

## 2018-12-11 NOTE — Plan of Care (Signed)
  RD consulted for nutrition education regarding diabetes.  RD working remotely.  No results found for: HGBA1C  Spoke with patient via phone this afternoon. He reports that he is currently homeless and living out of his car. Patient reports inconsistent po intake due to current homeless status and recalls fast food and convenient store snacks. He reports recent positive lifestyle changes; stated "I quit drinking alcohol and was trying to turn my life around before all this happened" RD acknowledged patient's recent efforts and encouraged him to continue once discharged.   RD discussed"Heart Healthy Consistent Carbohydrate" handout from the Academy of Nutrition and Dietetics. Discussed different food groups and their effects on blood sugar, emphasizing carbohydrate-containing foods. Provided list of carbohydrates and recommended serving sizes of common foods considerate of current social circumstances. Handout provided in discharged instructions for patient review.   Discussed importance of controlled and consistent carbohydrate intake throughout the day. Provided examples of ways to balance meals/snacks and encouraged intake of high-fiber, whole grain complex carbohydrates. Teach back method used.  Expect fair compliance.  There is no height or weight on file to calculate BMI. Pt meets criteria for WNL based on current BMI.  Current diet order is CM, patient is consuming approximately 100% of meals at this time. Labs and medications reviewed. No further nutrition interventions warranted at this time. RD contact information provided. If additional nutrition issues arise, please re-consult RD.  Lajuan Lines, [place], [place] [phone] After Hours/Weekend Pager: [phone]

## 2018-12-11 NOTE — Progress Notes (Signed)
AT about 930pm tonight. I was in talking to patient.  He seemed to be very anxious and shakey.  He was sweating a bit.  He verbally expressed being anxious and agitated.  I assessed his CIWA to be a 9.  I went ahead and gave him 2mg  of IV Ativan.  Came back to Assess him about an hour later and he is sleeping comfortably.  He is drenched in sweat though.  I scored his CIWA at 7 now.  His CBG is 261, which is down from 399.  Will continue to monitor patient.

## 2018-12-11 NOTE — H&P (Signed)
History and Physical    Justin AlbertsCalvin [name] UEA:[id number] DOB: 12/22/1990 DOA: 12/10/2018  PCP: Patient, No Pcp Per  Patient coming from: Home.  Chief Complaint: Generalized body ache nausea vomiting weakness.  HPI: Justin AlbertsCalvin [name] is a 28 y.o. male with no significant past medical history presents to the ER with complaints of neurolyse weakness body aches nausea vomiting abdominal pain for the last few days.  Has been having polyuria polydipsia.  Since patient symptoms have been worsening patient came to the ER.  Patient also has been a epigastric discomfort burning sensation.  Patient states he used to drink alcohol every day but has not been drinking for last 1 week.  ED Course: In the ER patient was tachycardic afebrile with labs showing sodium 119 bicarb 20 anion gap 20 glucose 976.  Urine shows some ketones and glucose.  COVID-19 test is pending.  Acute abdominal series shows stool burden.  EKG shows normal sinus rhythm.  Patient was given fluid bolus and IV insulin infusion for DKA with new onset diabetes mellitus admitted for further management.  Review of Systems: As per HPI, rest all negative.   History reviewed. No pertinent past medical history.  Past Surgical History:  Procedure Laterality Date  . right hand surgery.       reports that he has quit smoking. He has never used smokeless tobacco. He reports current alcohol use. He reports current drug use. Drug: Marijuana.  No Known Allergies  Family History  Problem Relation Age of Onset  . Diabetes Mellitus II Neg Hx     Prior to Admission medications   Not on File    Physical Exam: Constitutional: Moderately built and nourished. Vitals:   12/11/18 0045 12/11/18 0100 12/11/18 0130 12/11/18 0157  BP: 107/70 104/74 (!) 89/60 (!) 83/55  Pulse: 70 71 71 70  Resp: 14 13 13 20   Temp:      TempSrc:      SpO2: 100% 100% 100% 100%   Eyes: Anicteric no pallor. ENMT: No discharge from the ears eyes nose or mouth.  No mass  felt.  Neck: No mass felt.  No neck rigidity. Respiratory: No rhonchi or crepitations. Cardiovascular: S1-S2 heard. Abdomen: Soft nontender bowel sounds present. Musculoskeletal: No edema. Skin: No rash. Neurologic: Alert awake oriented to time place and person.  Moves all extremities. Psychiatric: Appears normal per normal affect.   Labs on Admission: I have personally reviewed following labs and imaging studies  CBC: Recent Labs  Lab 12/10/18 1428 12/10/18 2039  WBC 7.5 6.4  HGB 14.5 14.7  HCT 41.2 41.1  MCV 96.9 101.2*  PLT 377 378   Basic Metabolic Panel: Recent Labs  Lab 12/10/18 1428 12/10/18 2039  NA 122* 119*  K 4.3 4.7  CL 81* 79*  CO2 22 20*  GLUCOSE 844* 976*  BUN 14 17  CREATININE 1.25* 1.20  CALCIUM 8.9 8.8*   GFR: Estimated Creatinine Clearance: 80 mL/min (by C-G formula based on SCr of 1.2 mg/dL). Liver Function Tests: Recent Labs  Lab 12/10/18 1428  AST 54*  ALT 53*  ALKPHOS 175*  BILITOT 1.5*  PROT 6.3*  ALBUMIN 3.8   Recent Labs  Lab 12/10/18 1428  LIPASE 45   No results for input(s): AMMONIA in the last 168 hours. Coagulation Profile: No results for input(s): INR, PROTIME in the last 168 hours. Cardiac Enzymes: No results for input(s): CKTOTAL, CKMB, CKMBINDEX, TROPONINI in the last 168 hours. BNP (last 3 results) No results for input(s): PROBNP  in the last 8760 hours. HbA1C: No results for input(s): HGBA1C in the last 72 hours. CBG: Recent Labs  Lab 12/10/18 2027 12/11/18 0112 12/11/18 0229  GLUCAP >600* 458* 383*   Lipid Profile: No results for input(s): CHOL, HDL, LDLCALC, TRIG, CHOLHDL, LDLDIRECT in the last 72 hours. Thyroid Function Tests: No results for input(s): TSH, T4TOTAL, FREET4, T3FREE, THYROIDAB in the last 72 hours. Anemia Panel: No results for input(s): VITAMINB12, FOLATE, FERRITIN, TIBC, IRON, RETICCTPCT in the last 72 hours. Urine analysis:    Component Value Date/Time   COLORURINE COLORLESS (A)  12/10/2018 2259   APPEARANCEUR CLEAR 12/10/2018 2259   LABSPEC 1.026 12/10/2018 2259   PHURINE 6.0 12/10/2018 2259   GLUCOSEU >=500 (A) 12/10/2018 2259   HGBUR NEGATIVE 12/10/2018 2259   BILIRUBINUR NEGATIVE 12/10/2018 2259   KETONESUR 20 (A) 12/10/2018 2259   PROTEINUR NEGATIVE 12/10/2018 2259   NITRITE NEGATIVE 12/10/2018 2259   LEUKOCYTESUR NEGATIVE 12/10/2018 2259   Sepsis Labs: @LABRCNTIP (procalcitonin:4,lacticidven:4) )No results found for this or any previous visit (from the past 240 hour(s)).   Radiological Exams on Admission: Dg Abd Acute 2+v W 1v Chest  Result Date: 12/11/2018 CLINICAL DATA:  Elevated glucose, losing weight, leg cramps and leg swelling EXAM: DG ABDOMEN ACUTE W/ 1V CHEST COMPARISON:  None. FINDINGS: There is no evidence of dilated bowel loops or free intraperitoneal air. Large volume of fecal material present throughout the colon and overlying the rectal vault. No radiopaque calculi or other significant radiographic abnormality is seen. Heart size and mediastinal contours are within normal limits. Both lungs are clear. IMPRESSION: Large volume of fecal material throughout the colon, correlate with features of constipation. No other acute abnormality in the abdomen or pelvis. No acute cardiopulmonary abnormality. Electronically Signed   By: Lovena Le M.D.   On: 12/11/2018 02:24    EKG: Independently reviewed.  Normal sinus rhythm.  Assessment/Plan Principal Problem:   DKA (diabetic ketoacidoses) (HCC) Active Problems:   Elevated LFTs   Hyponatremia   Nausea & vomiting    1. DKA probably and diabetes mellitus type 2 -patient has been started on insulin infusion IV fluids.  Closely follow metabolic panel for anion gap correction.  Check hemoglobin A1c.  Once anion gap is corrected change to long-acting insulin.  Will need diabetic coordinator education. 2. Epigastric pain and nausea vomiting could be from either diabetes induced gastroparesis or alcoholic  gastritis -we will keep patient on Protonix and closely observe.  Follow LFTs.  Lipase is normal. 3. Mildly elevated LFTs could be from alcoholism.  Has not had any alcohol for last 1 week.  Further work-up as outpatient. 4. History of alcoholism has not had any alcohol for last 1 week.  Closely monitor for any withdrawal. 5. Acute renal failure with hyponatremia likely from dehydration which I think will improve with hydration and correction of glucose.  Given that patient has dehydrated with DKA will need close monitoring for any further deterioration and will need inpatient status.  COVID-19 test is pending.   DVT prophylaxis: Lovenox. Code Status: Full code. Family Communication: Discussed with patient. Disposition Plan: Home. Consults called: None. Admission status: Inpatient.   Rise Patience MD [facility] Pager [phone].  If 7PM-7AM, please contact night-coverage [email or web address] Password [facility]  12/11/2018, 2:36 AM

## 2018-12-12 LAB — COMPREHENSIVE METABOLIC PANEL
ALT: 32 U/L (ref 0–44)
AST: 30 U/L (ref 15–41)
Albumin: 2.6 g/dL — ABNORMAL LOW (ref 3.5–5.0)
Alkaline Phosphatase: 92 U/L (ref 38–126)
Anion gap: 7 (ref 5–15)
BUN: 9 mg/dL (ref 6–20)
CO2: 29 mmol/L (ref 22–32)
Calcium: 8.1 mg/dL — ABNORMAL LOW (ref 8.9–10.3)
Chloride: 99 mmol/L (ref 98–111)
Creatinine, Ser: 0.32 mg/dL — ABNORMAL LOW (ref 0.61–1.24)
GFR calc Af Amer: >60 mL/min (ref >60)
GFR calc non Af Amer: >60 mL/min (ref >60)
Glucose, Bld: 175 mg/dL — ABNORMAL HIGH (ref 70–99)
Potassium: 3.4 mmol/L — ABNORMAL LOW (ref 3.5–5.1)
Sodium: 135 mmol/L (ref 135–145)
Total Bilirubin: 0.6 mg/dL (ref 0.3–1.2)
Total Protein: 4.1 g/dL — ABNORMAL LOW (ref 6.5–8.1)

## 2018-12-12 LAB — GLUCOSE, CAPILLARY
Glucose-Capillary: 194 mg/dL — ABNORMAL HIGH (ref 70–99)
Glucose-Capillary: 161 mg/dL — ABNORMAL HIGH (ref 70–99)
Glucose-Capillary: 396 mg/dL — ABNORMAL HIGH (ref 70–99)
Glucose-Capillary: 336 mg/dL — ABNORMAL HIGH (ref 70–99)
Glucose-Capillary: 100 mg/dL — ABNORMAL HIGH (ref 70–99)

## 2018-12-12 LAB — CBC WITH DIFFERENTIAL/PLATELET
Abs Immature Granulocytes: 0.03 10*3/uL (ref 0.00–0.07)
Basophils Absolute: 0 10*3/uL (ref 0.0–0.1)
Basophils Relative: 0 %
Eosinophils Absolute: 0.3 10*3/uL (ref 0.0–0.5)
Eosinophils Relative: 4 %
HCT: 35.6 % — ABNORMAL LOW (ref 39.0–52.0)
Hemoglobin: 13.3 g/dL (ref 13.0–17.0)
Immature Granulocytes: 0 %
Lymphocytes Relative: 41 %
Lymphs Abs: 2.9 10*3/uL (ref 0.7–4.0)
MCH: 34.6 pg — ABNORMAL HIGH (ref 26.0–34.0)
MCHC: 37.4 g/dL — ABNORMAL HIGH (ref 30.0–36.0)
MCV: 92.7 fL (ref 80.0–100.0)
Monocytes Absolute: 0.7 10*3/uL (ref 0.1–1.0)
Monocytes Relative: 9 %
Neutro Abs: 3.2 10*3/uL (ref 1.7–7.7)
Neutrophils Relative %: 46 %
Platelets: 260 10*3/uL (ref 150–400)
RBC: 3.84 MIL/uL — ABNORMAL LOW (ref 4.22–5.81)
RDW: 11.7 % (ref 11.5–15.5)
WBC: 7.1 10*3/uL (ref 4.0–10.5)
nRBC: 0 % (ref 0.0–0.2)

## 2018-12-12 LAB — MAGNESIUM: Magnesium: 2.1 mg/dL (ref 1.7–2.4)

## 2018-12-12 MED ORDER — POTASSIUM CHLORIDE CRYS ER 20 MEQ PO TBCR
40.0000 meq | EXTENDED_RELEASE_TABLET | Freq: Once | ORAL | Status: AC
  Administered 2018-12-12: 09:00:00 40 meq via ORAL
  Filled 2018-12-12: qty 2

## 2018-12-12 MED ORDER — INSULIN GLARGINE 100 UNIT/ML ~~LOC~~ SOLN
45.0000 [IU] | Freq: Every day | SUBCUTANEOUS | Status: DC
  Administered 2018-12-12: 45 [IU] via SUBCUTANEOUS
  Filled 2018-12-12: qty 0.45

## 2018-12-12 MED ORDER — THIAMINE HCL 100 MG PO TABS: 100.0000 mg | ORAL_TABLET | Freq: Every day | ORAL | 0 refills | Status: DC

## 2018-12-12 MED ORDER — FOLIC ACID 1 MG PO TABS: 1.0000 mg | ORAL_TABLET | Freq: Every day | ORAL | 0 refills | Status: AC

## 2018-12-12 MED ORDER — INSULIN GLARGINE 100 UNIT/ML ~~LOC~~ SOLN: 45.0000 [IU] | Freq: Every day | SUBCUTANEOUS | 0 refills | Status: DC

## 2018-12-12 MED ORDER — BLOOD GLUCOSE MONITOR KIT: PACK | 1 refills | Status: DC

## 2018-12-12 MED ORDER — "INSULIN SYRINGE-NEEDLE U-100 25G X 1"" 1 ML MISC": 0 refills | Status: DC

## 2018-12-12 MED ORDER — CHLORDIAZEPOXIDE HCL 5 MG PO CAPS
15.0000 mg | ORAL_CAPSULE | Freq: Three times a day (TID) | ORAL | Status: DC
  Administered 2018-12-12 (×2): 15 mg via ORAL
  Filled 2018-12-12 (×2): qty 3

## 2018-12-12 MED ORDER — LACTATED RINGERS IV SOLN
INTRAVENOUS | Status: AC
  Administered 2018-12-12: 09:00:00 via INTRAVENOUS

## 2018-12-12 MED ORDER — INSULIN ASPART 100 UNIT/ML ~~LOC~~ SOLN: SUBCUTANEOUS | 0 refills | Status: DC

## 2018-12-12 NOTE — Progress Notes (Signed)
Inpatient Diabetes Program Recommendations  AACE/ADA: New Consensus Statement on Inpatient Glycemic Control (2015)  Target Ranges:  Prepandial:   less than 140 mg/dL      Peak postprandial:   less than 180 mg/dL (1-2 hours)      Critically ill patients:  140 - 180 mg/dL   Lab Results  Component Value Date   GLUCAP 396 (H) 12/12/2018    Review of Glycemic Control  Diabetes history: New onset  Inpatient Diabetes Program Recommendations:    Spoke with patient via phone and discussed diagnosis of new onset diabetes. Requested nurses to educate patient on use of insulin syringe and allow patient to give own injections while in the hospital to prepare for discharge. Reviewed basic nutrition questions with patient and requested patient to drink unsweetened drinks from diet. Patient states he has family in [place] and did have family here but family does not speak to him because when he started getting sick, family assumed he was on drugs and then he was living out of his jeep. Spoke with social worker Kingsley Spittle and case manager  Debbie to discuss options for patient regarding food and housing. Patient states he has no money for anything.  Thank you, Nani Gasser. Africa Masaki, RN, MSN, CDE  Diabetes Coordinator Inpatient Glycemic Control Team Team Pager [phone] (8am-5pm) 12/12/2018 11:10 AM

## 2018-12-12 NOTE — Discharge Instructions (Signed)
Follow with Primary MD in 7 days   Get CBC, CMP -  checked next visit within 1 week by Primary MD    Activity: As tolerated with Full fall precautions use Marcin/cane & assistance as needed  Disposition Home    Diet: Heart Healthy Low Carb  Accuchecks 4 times/day, Once in AM empty stomach and then before each meal. Log in all results and show them to your Prim.MD in 3 days. If any glucose reading is under 80 or above 300 call your Prim MD immidiately. Follow Low glucose instructions for glucose under 80 as instructed.   Special Instructions: If you have smoked or chewed Tobacco  in the last 2 yrs please stop smoking, stop any regular Alcohol  and or any Recreational drug use.  On your next visit with your primary care physician please Get Medicines reviewed and adjusted.  Please request your Prim.MD to go over all Hospital Tests and Procedure/Radiological results at the follow up, please get all Hospital records sent to your Prim MD by signing hospital release before you go home.  If you experience worsening of your admission symptoms, develop shortness of breath, life threatening emergency, suicidal or homicidal thoughts you must seek medical attention immediately by calling 911 or calling your MD immediately  if symptoms less severe.  You Must read complete instructions/literature along with all the possible adverse reactions/side effects for all the Medicines you take and that have been prescribed to you. Take any new Medicines after you have completely understood and accpet all the possible adverse reactions/side effects.        Heart Healthy, Consistent Carbohydrate Nutrition Therapy   A heart-healthy and consistent carbohydrate diet is recommended to manage heart disease and diabetes. To follow a heart-healthy and consistent carbohydrate diet,  Eat a balanced diet with whole grains, fruits and vegetables, and lean protein sources.   Choose heart-healthy unsaturated fats.  Limit saturated fats, trans fats, and cholesterol intake. Eat more plant-based or vegetarian meals using beans and soy foods for protein.   Eat whole, unprocessed foods to limit the amount of sodium (salt) you eat.   Choose a consistent amount of carbohydrate at each meal and snack. Limit refined carbohydrates especially sugar, sweets and sugar-sweetened beverages.   If you drink alcohol, do so in moderation: one serving per day (women) and two servings per day (men). o One serving is equivalent to 12 ounces beer, 5 ounces wine, or 1.5 ounces distilled spirits  Tips Tips for Choosing Heart-Healthy Fats Choose lean protein and low-fat dairy foods to reduce saturated fat intake.  Saturated fat is usually found in animal-based protein and is associated with certain health risks. Saturated fat is the biggest contributor to raise low-density lipoprotein (LDL) cholesterol levels. Research shows that limiting saturated fat lowers unhealthy cholesterol levels. Eat no more than 7% of your total calories each day from saturated fat. Ask your RDN to help you determine how much saturated fat is right for you.  There are many foods that do not contain large amounts of saturated fats. Swapping these foods to replace foods high in saturated fats will help you limit the saturated fat you eat and improve your cholesterol levels. You can also try eating more plant-based or vegetarian meals. Instead of Try:  Whole milk, cheese, yogurt, and ice cream 1% or skim milk, low-fat cheese, non-fat yogurt, and low-fat ice cream  Fatty, marbled beef and pork Lean beef, pork, or venison  Poultry with skin Poultry without skin  Butter, stick margarine Reduced-fat, whipped, or liquid spreads  Coconut oil, palm oil Liquid vegetable oils: corn, canola, olive, soybean and safflower oils   Avoid foods that contain trans fats.  Trans fats increase levels of LDL-cholesterol. Hydrogenated fat in processed foods is the main  source of trans fats in foods.   Trans fats can be found in stick margarine, shortening, processed sweets, baked goods, some fried foods, and packaged foods made with hydrogenated oils. Avoid foods with partially hydrogenated oil on the ingredient list such as: cookies, pastries, baked goods, biscuits, crackers, microwave popcorn, and frozen dinners. Choose foods with heart healthy fats.  Polyunsaturated and monounsaturated fat are unsaturated fats that may help lower your blood cholesterol level when used in place of saturated fat in your diet.  Ask your RDN about taking a dietary supplement with plant sterols and stanols to help lower your cholesterol level.  Research shows that substituting saturated fats with unsaturated fats is beneficial to cholesterol levels. Try these easy swaps: Instead of Try:  Butter, stick margarine, or solid shortening Reduced-fat, whipped, or liquid spreads  Beef, pork, or poultry with skin Fish and seafood  Chips, crackers, snack foods Raw or unsalted nuts and seeds or nut butters Hummus with vegetables Avocado on toast  Coconut oil, palm oil Liquid vegetable oils: corn, canola, olive, soybean and safflower oils  Limit the amount of cholesterol you eat to less than 200 milligrams per day.  Cholesterol is a substance carried through the bloodstream via lipoproteins, which are known as transporters of fat. Some body functions need cholesterol to work properly, but too much cholesterol in the bloodstream can damage arteries and build up blood vessel linings (which can lead to heart attack and stroke). You should eat less than 200 milligrams cholesterol per day.  People respond differently to eating cholesterol. There is no test available right now that can figure out which people will respond more to dietary cholesterol and which will respond less. For individuals with high intake of dietary cholesterol, different types of increase (none, small, moderate, large)  in LDL-cholesterol levels are all possible.   Food sources of cholesterol include egg yolks and organ meats such as liver, gizzards. Limit egg yolks to two to four per week and avoid organ meats like liver and gizzards to control cholesterol intake. Tips for Choosing Heart-Healthy Carbohydrates Consume a consistent amount of carbohydrate  It is important to eat foods with carbohydrates in moderation because they impact your blood glucose level. Carbohydrates can be found in many foods such as:  Grains (breads, crackers, rice, pasta, and cereals)   Starchy Vegetables (potatoes, corn, and peas)   Beans and legumes   Milk, soy milk, and yogurt   Fruit and fruit juice   Sweets (cakes, cookies, ice cream, jam and jelly)  Your RDN will help you set a goal for how many carbohydrate servings to eat at your meals and snacks. For many adults, eating 3 to 5 servings of carbohydrate foods at each meal and 1 or 2 carbohydrate servings for each snack works well.   Check your blood glucose level regularly. It can tell you if you need to adjust when you eat carbohydrates.  Choose foods rich in viscous (soluble) fiber  Viscous, or soluble, is found in the walls of plant cells. Viscous fiber is found only in plant-based foods. Eating foods with fiber helps to lower your unhealthy cholesterol and keep your blood glucose in range   Rich sources of viscous fiber include  vegetables (asparagus, Brussels sprouts, sweet potatoes, turnips) fruit (apricots, mangoes, oranges), legumes, and whole grains (barley, oats, and oat bran).   As you increase your fiber intake gradually, also increase the amount of water you drink. This will help prevent constipation.   If you have difficulty achieving this goal, ask your RDN about fiber laxatives. Choose fiber supplements made with viscous fibers such as psyllium seed husks or methylcellulose to help lower unhealthy cholesterol.   Limit refined carbohydrates   There  are three types of carbohydrates: starches, sugar, and fiber. Some carbohydrates occur naturally in food, like the starches in rice or corn or the sugars in fruits and milk. Refined carbohydrates--foods with high amounts of simple sugars--can raise triglyceride levels. High triglyceride levels are associated with coronary heart disease.  Some examples of refined carbohydrate foods are table sugar, sweets, and beverages sweetened with added sugar. Tips for Reducing Sodium (Salt) Although sodium is important for your body to function, too much sodium can be harmful for people with high blood pressure. As sodium and fluid buildup in your tissues and bloodstream, your blood pressure increases. High blood pressure may cause damage to other organs and increase your risk for a stroke. Even if you take a pill for blood pressure or a water pill (diuretic) to remove fluid, it is still important to have less salt in your diet. Ask your doctor and RDN what amount of sodium is right for you.  Avoid processed foods. Eat more fresh foods.   Fresh fruits and vegetables are naturally low in sodium, as well as frozen vegetables and fruits that have no added juices or sauces.   Fresh meats are lower in sodium than processed meats, such as bacon, sausage, and hotdogs. Read the nutrition label or ask your butcher to help you find a fresh meat that is low in sodium.  Eat less salt--at the table and when cooking.   A single teaspoon of table salt has 2,300 mg of sodium.   Leave the salt out of recipes for pasta, casseroles, and soups.   Ask your RDN how to cook your favorite recipes without sodium  Be a smart shopper.   Look for food packages that say salt-free or sodium-free. These items contain less than 5 milligrams of sodium per serving.   Very low-sodium products contain less than 35 milligrams of sodium per serving.   Low-sodium products contain less than 140 milligrams of sodium per serving.    Beware for Unsalted or No Added Salt products. These items may still be high in sodium. Check the nutrition label.  Add flavors to your food without adding sodium.   Try lemon juice, lime juice, fruit juice or vinegar.   Dry or fresh herbs add flavor. Try basil, bay leaf, dill, rosemary, parsley, sage, dry mustard, nutmeg, thyme, and paprika.   Pepper, red pepper flakes, and cayenne pepper can add spice t your meals without adding sodium. Hot sauce contains sodium, but if you use just a drop or two, it will not add up to much.   Buy a sodium-free seasoning blend or make your own at home. Additional Lifestyle Tips Achieve and maintain a healthy weight.  Talk with your RDN or your doctor about what is a healthy weight for you.  Set goals to reach and maintain that weight.   To lose weight, reduce your calorie intake along with increasing your physical activity. A weight loss of 10 to 15 pounds could reduce LDL-cholesterol by 5 milligrams per  deciliter. Participate in physical activity.  Talk with your health care team to find out what types of physical activity are best for you. Set a plan to get about 30 minutes of exercise on most days.  Foods Recommended Food Group Foods Recommended  Grains Whole grain breads and cereals, including whole wheat, barley, rye, buckwheat, corn, teff, quinoa, millet, amaranth, brown or wild rice, sorghum, and oats Pasta, especially whole wheat or other whole grain types  [employer] [place] TravelersBrown rice, quinoa or wild rice Whole grain crackers, bread, rolls, pitas Home-made bread with reduced-sodium baking soda  Protein Foods Lean cuts of beef and pork (loin, leg, round, extra lean hamburger)  Skinless Press photographerpoultry Fish Venison and other wild game Dried beans and peas Nuts and nut butters Meat alternatives made with soy or textured vegetable protein  Egg whites or egg substitute Cold cuts made with lean meat or soy protein  Dairy Nonfat (skim), low-fat, or 1%-fat  milk  Nonfat or low-fat yogurt or cottage cheese Fat-free and low-fat cheese  Vegetables Fresh, frozen, or canned vegetables without added fat or salt   Fruits Fresh, frozen, canned, or dried fruit   Oils Unsaturated oils (corn, olive, peanut, soy, sunflower, canola)  Soft or liquid margarines and vegetable oil spreads  Salad dressings Seeds and nuts  Avocado   Foods Not Recommended Food Group Foods Not Recommended  Grains Breads or crackers topped with salt Cereals (hot or cold) with more than 300 mg sodium per serving Biscuits, cornbread, and other quick breads prepared with baking soda Bread crumbs or stuffing mix from a store High-fat bakery products, such as doughnuts, biscuits, croissants, danish pastries, pies, cookies Instant cooking foods to which you add hot water and stir--potatoes, noodles, rice, etc. Packaged starchy foods--seasoned noodle or rice dishes, stuffing mix, macaroni and cheese dinner Snacks made with partially hydrogenated oils, including chips, cheese puffs, snack mixes, regular crackers, butter-flavored popcorn  Protein Foods Higher-fat cuts of meats (ribs, t-bone steak, regular hamburger) Bacon, sausage, or hot dogs Cold cuts, such as salami or bologna, deli meats, cured meats, corned beef Organ meats (liver, brains, gizzards, sweetbreads) Poultry with skin Fried or smoked meat, poultry, and fish Whole eggs and egg yolks (more than 2-4 per week) Salted legumes, nuts, seeds, or nut/seed butters Meat alternatives with high levels of sodium (>300 mg per serving) or saturated fat (>5 g per serving)  Dairy Whole milk,?2% fat milk, buttermilk Whole milk yogurt or ice cream Cream Half-&-half Cream cheese Sour cream Cheese  Vegetables Canned or frozen vegetables with salt, fresh vegetables prepared with salt, butter, cheese, or cream sauce Fried vegetables Pickled vegetables such as olives, pickles, or sauerkraut  Fruits Fried fruits Fruits served with  butter or cream  Oils Butter, stick margarine, shortening Partially hydrogenated oils or trans fats Tropical oils (coconut, palm, palm kernel oils)  Other Candy, sugar sweetened soft drinks and desserts Salt, sea salt, garlic salt, and seasoning mixes containing salt Bouillon cubes Ketchup, barbecue sauce, Worcestershire sauce, soy sauce, teriyaki sauce Miso Salsa Pickles, olives, relish   Heart Healthy Consistent Carbohydrate Vegetarian (Lacto-Ovo) Sample 1-Day Menu  Breakfast 1 cup oatmeal, cooked (2 carbohydrate servings)   cup blueberries (1 carbohydrate serving)  11 almonds, without salt  1 cup 1% milk (1 carbohydrate serving)  1 cup coffee  Morning Snack 1 cup fat-free plain yogurt (1 carbohydrate serving)  Lunch 1 whole wheat bun (1 carbohydrate servings)  1 black bean burger (1 carbohydrate servings)  1 slice cheddar cheese, low sodium  2 slices tomatoes  2 leaves lettuce  1 teaspoon mustard  1 small pear (1 carbohydrate servings)  1 cup green tea, unsweetened  Afternoon Snack 1/3 cup trail mix with nuts, seeds, and raisins, without salt (1 carbohydrate servinga)  Evening Meal  cup meatless chicken  2/3 cup brown rice, cooked (2 carbohydrate servings)  1 cup broccoli, cooked (2/3 carbohydrate serving)   cup carrots, cooked (1/3 carbohydrate serving)  2 teaspoons olive oil  1 teaspoon balsamic vinegar  1 whole wheat dinner roll (1 carbohydrate serving)  1 teaspoon margarine, soft, tub  1 cup 1% milk (1 carbohydrate serving)  Evening Snack 1 extra small banana (1 carbohydrate serving)  1 tablespoon peanut butter   Heart Healthy Consistent Carbohydrate Vegan Sample 1-Day Menu  Breakfast 1 cup oatmeal, cooked (2 carbohydrate servings)   cup blueberries (1 carbohydrate serving)  11 almonds, without salt  1 cup soymilk fortified with calcium, vitamin B12, and vitamin D  1 cup coffee  Morning Snack 6 ounces soy yogurt (1 carbohydrate servings)  Lunch 1 whole  wheat bun(1 carbohydrate servings)  1 black bean burger (1 carbohydrate serving)  2 slices tomatoes  2 leaves lettuce  1 teaspoon mustard  1 small pear (1 carbohydrate servings)  1 cup green tea, unsweetened  Afternoon Snack 1/3 cup trail mix with nuts, seeds, and raisins, without salt (1 carbohydrate servings)  Evening Meal  cup meatless chicken  2/3 cup brown rice, cooked (2 carbohydrate servings)  1 cup broccoli, cooked (2/3 carbohydrate serving)   cup carrots, cooked (1/3 carbohydrate serving)  2 teaspoons olive oil  1 teaspoon balsamic vinegar  1 whole wheat dinner roll (1 carbohydrate serving)  1 teaspoon margarine, soft, tub  1 cup soymilk fortified with calcium, vitamin B12, and vitamin D  Evening Snack 1 extra small banana (1 carbohydrate serving)  1 tablespoon peanut butter    Heart Healthy Consistent Carbohydrate Sample 1-Day Menu  Breakfast 1 cup cooked oatmeal (2 carbohydrate servings)  3/4 cup blueberries (1 carbohydrate serving)  1 ounce almonds  1 cup skim milk (1 carbohydrate serving)  1 cup coffee  Morning Snack 1 cup sugar-free nonfat yogurt (1 carbohydrate serving)  Lunch 2 slices whole-wheat bread (2 carbohydrate servings)  2 ounces lean [place] breast  1 ounce low-fat Swiss cheese  1 teaspoon mustard  1 slice tomato  1 lettuce leaf  1 small pear (1 carbohydrate serving)  1 cup skim milk (1 carbohydrate serving)  Afternoon Snack 1 ounce trail mix with unsalted nuts, seeds, and raisins (1 carbohydrate serving)  Evening Meal 3 ounces salmon  2/3 cup cooked brown rice (2 carbohydrate servings)  1 teaspoon soft margarine  1 cup cooked broccoli with 1/2 cup cooked carrots (1 carbohydrate serving  Carrots, cooked, boiled, drained, without salt  1 cup lettuce  1 teaspoon olive oil with vinegar for dressing  1 small whole grain roll (1 carbohydrate serving)  1 teaspoon soft margarine  1 cup unsweetened tea  Evening Snack 1 extra-small banana (1  carbohydrate serving)  Copyright 2020  [employer]. All rights reserved.

## 2018-12-12 NOTE — Discharge Summary (Signed)
Justin [name] IWL:[id number] DOB: October 10, 1990 DOA: 12/10/2018  PCP: Patient, No Pcp Per  Admit date: 12/10/2018  Discharge date: 12/12/2018  Admitted From: Home  Disposition:  Home   Recommendations for Outpatient Follow-up:   Follow up with PCP in 1-2 weeks  PCP Please obtain BMP/CBC, 2 view CXR in 1week,  (see Discharge instructions)   PCP Please follow up on the following pending results:    Home Health: None   Equipment/Devices: None  Consultations: None  Discharge Condition: Stable    CODE STATUS: Full    Diet Recommendation: Heart Healthy Low Carb    Chief Complaint  Patient presents with  . Multiple Complaints     Brief history of present illness from the day of admission and additional interim summary    Justin [name] a 28 y.o.malewithno significant past medical history presents to the ER with complaints of neurolyse weakness body aches nausea vomiting abdominal pain for the last few days. Has been having polyuria polydipsia.  He was diagnosed with DKA, new onset DM type II admitted to the hospital                                                                 Hospital Course    1.  DKA in a newly diagnosed DM type II.  Has been treated with aggressive IV fluids, IV insulin, anion gap is closed and DKA has resolved, he has been transitioned to Lantus and sliding scale, case management to assist with procurement of medications.  Diabetic and insulin education has been provided.  Will be discharged home.  Quested to follow with PCP in a week.  2.  Severe dehydration with AKI.    Is been aggressively hydrated with IV fluids, AKI resolved, close to baseline.  3.  Hypokalemia.    Replaced again today.  4.  History of alcohol abuse, has not drank alcohol in 1 week.    Was on CIWA protocol no  signs of DTs, placed on folic acid and thiamine.  Social work to assist with outpatient alcohol abstinence help.  5.  Elevated LFTs.    Due to combination of alcohol abuse and dehydration with hypotension, resolved with supportive care.  6.  Epigastric pain nausea vomiting upon admission.  Likely due to DKA.    All resolved.  Discharge diagnosis     Principal Problem:   DKA (diabetic ketoacidoses) (HCC) Active Problems:   Elevated LFTs   Hyponatremia   Nausea & vomiting    Discharge instructions    Discharge Instructions    Discharge instructions   Complete by: As directed    Follow with Primary MD in 7 days   Get CBC, CMP -  checked next visit within 1 week by Primary MD    Activity: As tolerated with Full  fall precautions use Brandt/cane & assistance as needed  Disposition Home    Diet: Heart Healthy Low Carb  Accuchecks 4 times/day, Once in AM empty stomach and then before each meal. Log in all results and show them to your Prim.MD in 3 days. If any glucose reading is under 80 or above 300 call your Prim MD immidiately. Follow Low glucose instructions for glucose under 80 as instructed.   Special Instructions: If you have smoked or chewed Tobacco  in the last 2 yrs please stop smoking, stop any regular Alcohol  and or any Recreational drug use.  On your next visit with your primary care physician please Get Medicines reviewed and adjusted.  Please request your Prim.MD to go over all Hospital Tests and Procedure/Radiological results at the follow up, please get all Hospital records sent to your Prim MD by signing hospital release before you go home.  If you experience worsening of your admission symptoms, develop shortness of breath, life threatening emergency, suicidal or homicidal thoughts you must seek medical attention immediately by calling 911 or calling your MD immediately  if symptoms less severe.  You Must read complete instructions/literature along with  all the possible adverse reactions/side effects for all the Medicines you take and that have been prescribed to you. Take any new Medicines after you have completely understood and accpet all the possible adverse reactions/side effects.   Increase activity slowly   Complete by: As directed       Discharge Medications   Allergies as of 12/12/2018   No Known Allergies     Medication List    TAKE these medications   blood glucose meter kit and supplies Kit Dispense based on patient and insurance preference. Use up to four times daily as directed. (FOR ICD-9 250.00, 250.01). For QAC - HS accuchecks.   folic acid 1 MG tablet Commonly known as: FOLVITE Take 1 tablet (1 mg total) by mouth daily. Start taking on: December 13, 2018   insulin aspart 100 UNIT/ML injection Commonly known as: NovoLOG Substitute to any brand approved.Before each meal 3 times a day, 140-199 - 2 units, 200-250 - 4 units, 251-299 - 6 units,  300-349 - 8 units,  350 or above 10 units. Dispense syringes and needles as needed, Ok to switch to PEN if approved. DX DM2, Code E11.65   insulin glargine 100 UNIT/ML injection Commonly known as: Lantus Inject 0.45 mLs (45 Units total) into the skin at bedtime. Dispense insulin pen if approved, if not dispense as needed syringes and needles for 1 month supply. Can switch to Levemir. Diagnosis E 11.65.   Insulin Syringe-Needle U-100 25G X 1" 1 ML Misc For 4 times a day insulin SQ, 1 month supply. Diagnosis E11.65   thiamine 100 MG tablet Take 1 tablet (100 mg total) by mouth daily. Start taking on: December 13, 2018       Follow-up Information    [facility]. Schedule an appointment as soon as possible for a visit in 1 week(s).   Contact information: [address] [place] [address] [phone]          Major procedures and Radiology Reports - PLEASE review detailed and final reports thoroughly  -          Dg Abd Acute 2+v W 1v Chest  Result Date: 12/11/2018 CLINICAL DATA:  Elevated glucose, losing weight, leg cramps and leg swelling EXAM: DG ABDOMEN ACUTE W/ 1V CHEST COMPARISON:  None.  FINDINGS: There is no evidence of dilated bowel loops or free intraperitoneal air. Large volume of fecal material present throughout the colon and overlying the rectal vault. No radiopaque calculi or other significant radiographic abnormality is seen. Heart size and mediastinal contours are within normal limits. Both lungs are clear. IMPRESSION: Large volume of fecal material throughout the colon, correlate with features of constipation. No other acute abnormality in the abdomen or pelvis. No acute cardiopulmonary abnormality. Electronically Signed   By: Lovena Le M.D.   On: 12/11/2018 02:24    Micro Results     Recent Results (from the past 240 hour(s))  SARS CORONAVIRUS 2 (TAT 6-24 HRS) Nasopharyngeal Nasopharyngeal Swab     Status: None   Collection Time: 12/11/18 12:49 AM   Specimen: Nasopharyngeal Swab  Result Value Ref Range Status   SARS Coronavirus 2 NEGATIVE NEGATIVE Final    Comment: (NOTE) SARS-CoV-2 target nucleic acids are NOT DETECTED. The SARS-CoV-2 RNA is generally detectable in upper and lower respiratory specimens during the acute phase of infection. Negative results do not preclude SARS-CoV-2 infection, do not rule out co-infections with other pathogens, and should not be used as the sole basis for treatment or other patient management decisions. Negative results must be combined with clinical observations, patient history, and epidemiological information. The expected result is Negative. Fact Sheet for Patients: SugarRoll.be Fact Sheet for Healthcare Providers: https://www.woods-mathews.com/ This test is not yet approved or cleared by the [place] FDA and  has been authorized for detection and/or diagnosis of SARS-CoV-2 by FDA under an  Emergency Use Authorization (EUA). This EUA will remain  in effect (meaning this test can be used) for the duration of the COVID-19 declaration under Section 56 4(b)(1) of the Act, 21 U.S.C. section 360bbb-3(b)(1), unless the authorization is terminated or revoked sooner. Performed at [facility], [place] [address], [place], [place] [address]   MRSA PCR Screening     Status: None   Collection Time: 12/11/18  4:13 AM   Specimen: Nasal Mucosa; Nasopharyngeal  Result Value Ref Range Status   MRSA by PCR NEGATIVE NEGATIVE Final    Comment:        The GeneXpert MRSA Assay (FDA approved for NASAL specimens only), is one component of a comprehensive MRSA colonization surveillance program. It is not intended to diagnose MRSA infection nor to guide or monitor treatment for MRSA infections. Performed at [facility], [place] [address], [place], [place] [address]     Today   Subjective    Miraj Truss today has no headache,no chest abdominal pain,no new weakness tingling or numbness, feels much better wants to go home today.  Symptom-free this morning, sitting up in chair wants to take a shower.   Objective   Blood pressure 105/60, pulse 85, temperature (!) 97.4 F (36.3 C), temperature source Oral, resp. rate 16, weight 58 kg, SpO2 99 %.   Intake/Output Summary (Last 24 hours) at 12/12/2018 1118 Last data filed at 12/12/2018 0300 Gross per 24 hour  Intake 1610.49 ml  Output -  Net 1610.49 ml    Exam Awake Alert, Oriented x 3, No new F.N deficits, Normal affect [place].AT,PERRAL Supple Neck,No JVD, No cervical lymphadenopathy appriciated.  Symmetrical Chest wall movement, Good air movement bilaterally, CTAB RRR,No Gallops,Rubs or new Murmurs, No Parasternal Heave +ve B.Sounds, Abd Soft, Non tender, No organomegaly appriciated, No rebound -guarding or rigidity. No Cyanosis, Clubbing or edema, No new Rash or bruise   Data Review  CBC w Diff:  Lab Results  Component  Value Date   WBC 7.1 12/12/2018   HGB 13.3 12/12/2018   HCT 35.6 (L) 12/12/2018   PLT 260 12/12/2018   LYMPHOPCT 41 12/12/2018   MONOPCT 9 12/12/2018   EOSPCT 4 12/12/2018   BASOPCT 0 12/12/2018    CMP:  Lab Results  Component Value Date   NA 135 12/12/2018   K 3.4 (L) 12/12/2018   CL 99 12/12/2018   CO2 29 12/12/2018   BUN 9 12/12/2018   CREATININE 0.32 (L) 12/12/2018   PROT 4.1 (L) 12/12/2018   ALBUMIN 2.6 (L) 12/12/2018   BILITOT 0.6 12/12/2018   ALKPHOS 92 12/12/2018   AST 30 12/12/2018   ALT 32 12/12/2018  .  No results found for: HGBA1C  Total Time in preparing paper work, data evaluation and todays exam - 54 minutes  Lala Lund M.D on 12/12/2018 at 11:18 AM  [facility]   Office  [phone]

## 2018-12-12 NOTE — TOC Progression Note (Signed)
Transition of Care [facility]) - Progression Note    Patient Details  Name: Justin [name] MRN: [id number] Date of Birth: 1990-10-03  Transition [facility] CM/SW Contact  Carles Collet, RN Phone Number: 12/12/2018, 11:15 AM  Clinical Narrative:    CM provided patient with [facility] letter with overrides for copays. Number for [facility] and Wellness added to AVS, patient to call for follow up appointment and will plan to use their pharmacy in the future. CSW following for housing/ food insecurities.    Expected Discharge Plan: Home/Self Care Barriers to Discharge: Inadequate or no insurance, Barriers Unresolved (comment), Continued Medical Work up  Expected Discharge Plan and Services Expected Discharge Plan: Home/Self Care   Discharge Planning Services: CM Consult                                           Social Determinants of Health (SDOH) Interventions    Readmission Risk Interventions No flowsheet data found.

## 2018-12-12 NOTE — Progress Notes (Signed)
CSW provided patient with information for free meals/ free food pantries in the local area for assistance with food. CSW also provided patient with information for local shelter information however patient is currently staying in his car and has plans to continue staying in his car until he is able to local new housing arrangements. Patient was provided information for [facility] and Wellness for follow up.   No other needs at this time.   Kingsley Spittle, LCSW Transitions of [place]  [phone]

## 2018-12-13 ENCOUNTER — Emergency Department (HOSPITAL_COMMUNITY): Admission: EM | Admit: 2018-12-13 | Discharge: 2018-12-13 | Discharge status: Against Medical Advice | Payer: MEDICAID

## 2018-12-13 ENCOUNTER — Emergency Department (HOSPITAL_COMMUNITY)
Admission: EM | Admit: 2018-12-13 | Discharge: 2018-12-13 | Disposition: A | Discharge status: Home / Self Care | Payer: MEDICAID

## 2018-12-13 LAB — HEMOGLOBIN A1C
Hgb A1c MFr Bld: >15.5 % — ABNORMAL HIGH (ref 4.8–5.6)
Mean Plasma Glucose: >398 mg/dL

## 2018-12-13 NOTE — ED Triage Notes (Addendum)
Patient here because he was unable to get his glucose meter strips filled at the pharmacy and they told him to come back here. Patient states he received all other prescriptions. No other needs or complaints.

## 2018-12-13 NOTE — ED Triage Notes (Signed)
Patient received call from the pharmacy, who told him that he just needed to pay for the strips since they were not covered by insurance. Patient states he does not need to be seen.

## 2018-12-14 LAB — HEMOGLOBIN A1C: Hgb A1c MFr Bld: UNDETERMINED % (ref 4.8–5.6)

## 2019-09-05 ENCOUNTER — Emergency Department (HOSPITAL_COMMUNITY)
Admission: EM | Admit: 2019-09-05 | Discharge: 2019-09-06 | Disposition: A | Discharge status: Home / Self Care | Payer: Medicaid Other | Attending: Emergency Medicine | Admitting: Emergency Medicine

## 2019-09-05 ENCOUNTER — Other Ambulatory Visit: Payer: Self-pay

## 2019-09-05 ENCOUNTER — Encounter (HOSPITAL_COMMUNITY): Payer: Self-pay | Admitting: Emergency Medicine

## 2019-09-05 DIAGNOSIS — Z5900 Homelessness unspecified: Secondary | ICD-10-CM

## 2019-09-05 DIAGNOSIS — Z794 Long term (current) use of insulin: Secondary | ICD-10-CM | POA: Insufficient documentation

## 2019-09-05 DIAGNOSIS — Z79899 Other long term (current) drug therapy: Secondary | ICD-10-CM | POA: Insufficient documentation

## 2019-09-05 DIAGNOSIS — R631 Polydipsia: Secondary | ICD-10-CM | POA: Insufficient documentation

## 2019-09-05 DIAGNOSIS — M7918 Myalgia, other site: Secondary | ICD-10-CM | POA: Insufficient documentation

## 2019-09-05 DIAGNOSIS — E1165 Type 2 diabetes mellitus with hyperglycemia: Secondary | ICD-10-CM | POA: Insufficient documentation

## 2019-09-05 DIAGNOSIS — Z87891 Personal history of nicotine dependence: Secondary | ICD-10-CM | POA: Insufficient documentation

## 2019-09-05 DIAGNOSIS — R358 Other polyuria: Secondary | ICD-10-CM | POA: Insufficient documentation

## 2019-09-05 DIAGNOSIS — R739 Hyperglycemia, unspecified: Secondary | ICD-10-CM

## 2019-09-05 HISTORY — DX: Type 2 diabetes mellitus without complications: E11.9

## 2019-09-05 LAB — CBG MONITORING, ED
Glucose-Capillary: 508 mg/dL (ref 70–99)
Glucose-Capillary: 359 mg/dL — ABNORMAL HIGH (ref 70–99)

## 2019-09-05 LAB — BETA-HYDROXYBUTYRIC ACID: Beta-Hydroxybutyric Acid: 1.09 mmol/L — ABNORMAL HIGH (ref 0.05–0.27)

## 2019-09-05 LAB — I-STAT CHEM 8, ED
BUN: 12 mg/dL (ref 6–20)
Calcium, Ion: 1.07 mmol/L — ABNORMAL LOW (ref 1.15–1.40)
Chloride: 100 mmol/L (ref 98–111)
Creatinine, Ser: 0.6 mg/dL — ABNORMAL LOW (ref 0.61–1.24)
Glucose, Bld: 555 mg/dL (ref 70–99)
HCT: 36 % — ABNORMAL LOW (ref 39.0–52.0)
Hemoglobin: 12.2 g/dL — ABNORMAL LOW (ref 13.0–17.0)
Potassium: 3.9 mmol/L (ref 3.5–5.1)
Sodium: 136 mmol/L (ref 135–145)
TCO2: 23 mmol/L (ref 22–32)

## 2019-09-05 LAB — URINALYSIS, ROUTINE W REFLEX MICROSCOPIC
Bacteria, UA: NONE SEEN
Bilirubin Urine: NEGATIVE
Glucose, UA: >=500 mg/dL — AB
Hgb urine dipstick: NEGATIVE
Ketones, ur: 20 mg/dL — AB
Leukocytes,Ua: NEGATIVE
Nitrite: NEGATIVE
Protein, ur: NEGATIVE mg/dL
Specific Gravity, Urine: 1.028 (ref 1.005–1.030)
pH: 6 (ref 5.0–8.0)

## 2019-09-05 LAB — COMPREHENSIVE METABOLIC PANEL
ALT: 23 U/L (ref 0–44)
AST: 25 U/L (ref 15–41)
Albumin: 2.9 g/dL — ABNORMAL LOW (ref 3.5–5.0)
Alkaline Phosphatase: 136 U/L — ABNORMAL HIGH (ref 38–126)
Anion gap: 7 (ref 5–15)
BUN: 13 mg/dL (ref 6–20)
CO2: 22 mmol/L (ref 22–32)
Calcium: 7.2 mg/dL — ABNORMAL LOW (ref 8.9–10.3)
Chloride: 104 mmol/L (ref 98–111)
Creatinine, Ser: 0.75 mg/dL (ref 0.61–1.24)
GFR calc Af Amer: >60 mL/min (ref >60)
GFR calc non Af Amer: >60 mL/min (ref >60)
Glucose, Bld: 527 mg/dL (ref 70–99)
Potassium: 3.9 mmol/L (ref 3.5–5.1)
Sodium: 133 mmol/L — ABNORMAL LOW (ref 135–145)
Total Bilirubin: 0.9 mg/dL (ref 0.3–1.2)
Total Protein: 5.3 g/dL — ABNORMAL LOW (ref 6.5–8.1)

## 2019-09-05 LAB — BLOOD GAS, VENOUS
Acid-base deficit: 1.4 mmol/L (ref 0.0–2.0)
Bicarbonate: 23.5 mmol/L (ref 20.0–28.0)
O2 Saturation: 91.8 %
Patient temperature: 98.6
pCO2, Ven: 42.2 mmHg — ABNORMAL LOW (ref 44.0–60.0)
pH, Ven: 7.364 (ref 7.250–7.430)
pO2, Ven: 65.6 mmHg — ABNORMAL HIGH (ref 32.0–45.0)

## 2019-09-05 LAB — CBC WITH DIFFERENTIAL/PLATELET
Abs Immature Granulocytes: 0.01 10*3/uL (ref 0.00–0.07)
Basophils Absolute: 0 10*3/uL (ref 0.0–0.1)
Basophils Relative: 1 %
Eosinophils Absolute: 0.2 10*3/uL (ref 0.0–0.5)
Eosinophils Relative: 4 %
HCT: 38 % — ABNORMAL LOW (ref 39.0–52.0)
Hemoglobin: 13 g/dL (ref 13.0–17.0)
Immature Granulocytes: 0 %
Lymphocytes Relative: 41 %
Lymphs Abs: 2.3 10*3/uL (ref 0.7–4.0)
MCH: 31.8 pg (ref 26.0–34.0)
MCHC: 34.2 g/dL (ref 30.0–36.0)
MCV: 92.9 fL (ref 80.0–100.0)
Monocytes Absolute: 0.5 10*3/uL (ref 0.1–1.0)
Monocytes Relative: 9 %
Neutro Abs: 2.6 10*3/uL (ref 1.7–7.7)
Neutrophils Relative %: 45 %
Platelets: 253 10*3/uL (ref 150–400)
RBC: 4.09 MIL/uL — ABNORMAL LOW (ref 4.22–5.81)
RDW: 12.2 % (ref 11.5–15.5)
WBC: 5.7 10*3/uL (ref 4.0–10.5)
nRBC: 0 % (ref 0.0–0.2)

## 2019-09-05 LAB — MAGNESIUM: Magnesium: 1.8 mg/dL (ref 1.7–2.4)

## 2019-09-05 MED ORDER — INSULIN GLARGINE 100 UNIT/ML ~~LOC~~ SOLN
45.0000 [IU] | Freq: Every day | SUBCUTANEOUS | Status: DC
  Administered 2019-09-05: 45 [IU] via SUBCUTANEOUS
  Filled 2019-09-05: qty 0.45

## 2019-09-05 MED ORDER — INSULIN ASPART 100 UNIT/ML ~~LOC~~ SOLN
0.0000 [IU] | Freq: Three times a day (TID) | SUBCUTANEOUS | Status: DC
  Administered 2019-09-06: 11 [IU] via SUBCUTANEOUS
  Filled 2019-09-05: qty 0.15

## 2019-09-05 MED ORDER — SODIUM CHLORIDE 0.9 % IV BOLUS
1000.0000 mL | Freq: Once | INTRAVENOUS | Status: AC
  Administered 2019-09-05: 1000 mL via INTRAVENOUS

## 2019-09-05 MED ORDER — INSULIN ASPART 100 UNIT/ML ~~LOC~~ SOLN
10.0000 [IU] | Freq: Once | SUBCUTANEOUS | Status: AC
  Administered 2019-09-05: 10 [IU] via SUBCUTANEOUS
  Filled 2019-09-05: qty 0.1
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
## 2019-09-05 NOTE — Progress Notes (Signed)
Consult request has been received. CSW attempting to follow up at present time.  Pt, per the EPD, is in need of meds (insulin) etc and CSW will speak to pt as there is not a RN CM to consult for pt's med needs.  CSW will continue to follow for D/C needs.  Dorothe Pea. Katlen Seyer  MSW, LCSW, LCAS, CSI Transitions of Care Clinical Social Worker Care Coordination Department Ph: [phone]

## 2019-09-06 LAB — CBG MONITORING, ED: Glucose-Capillary: 312 mg/dL — ABNORMAL HIGH (ref 70–99)

## 2019-09-06 MED ORDER — "INSULIN SYRINGE-NEEDLE U-100 25G X 1"" 1 ML MISC": 0 refills | Status: DC

## 2019-09-06 MED ORDER — INSULIN GLARGINE 100 UNIT/ML ~~LOC~~ SOLN: 45.0000 [IU] | Freq: Every day | SUBCUTANEOUS | 0 refills | Status: DC

## 2019-09-06 NOTE — ED Notes (Signed)
Patient was given discharge paperwork. Patient refused to sign for paperwork due to eye pain from lights in room.   Patient verbalized understanding for discharge.

## 2019-09-06 NOTE — Discharge Planning (Signed)
RNCM consulted regarding homeless pt needing medication assistance.  RNCM advised to have pt go to [facility] for a one time free Rx fill and to register with Loss adjuster, chartered [facility]) medical team for consistent medical care and Rx needs.
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
## 2019-09-06 NOTE — ED Notes (Signed)
Patient refused to get up from bed for discharge. Security is at bedside to assist with discharge.

## 2019-09-13 ENCOUNTER — Emergency Department (HOSPITAL_COMMUNITY)
Admission: EM | Admit: 2019-09-13 | Discharge: 2019-09-14 | Disposition: A | Discharge status: Home / Self Care | Payer: Self-pay | Attending: Emergency Medicine | Admitting: Emergency Medicine

## 2019-09-13 ENCOUNTER — Emergency Department (HOSPITAL_COMMUNITY): Payer: Self-pay

## 2019-09-13 DIAGNOSIS — Z87891 Personal history of nicotine dependence: Secondary | ICD-10-CM | POA: Insufficient documentation

## 2019-09-13 DIAGNOSIS — Z23 Encounter for immunization: Secondary | ICD-10-CM | POA: Insufficient documentation

## 2019-09-13 DIAGNOSIS — M79671 Pain in right foot: Secondary | ICD-10-CM | POA: Insufficient documentation

## 2019-09-13 DIAGNOSIS — R739 Hyperglycemia, unspecified: Secondary | ICD-10-CM

## 2019-09-13 DIAGNOSIS — F1510 Other stimulant abuse, uncomplicated: Secondary | ICD-10-CM | POA: Insufficient documentation

## 2019-09-13 DIAGNOSIS — Z794 Long term (current) use of insulin: Secondary | ICD-10-CM | POA: Insufficient documentation

## 2019-09-13 DIAGNOSIS — F1590 Other stimulant use, unspecified, uncomplicated: Secondary | ICD-10-CM | POA: Diagnosis present

## 2019-09-13 DIAGNOSIS — E1165 Type 2 diabetes mellitus with hyperglycemia: Secondary | ICD-10-CM | POA: Insufficient documentation

## 2019-09-13 LAB — URINALYSIS, ROUTINE W REFLEX MICROSCOPIC
Bacteria, UA: NONE SEEN
Bilirubin Urine: NEGATIVE
Glucose, UA: >=500 mg/dL — AB
Hgb urine dipstick: NEGATIVE
Ketones, ur: NEGATIVE mg/dL
Leukocytes,Ua: NEGATIVE
Nitrite: NEGATIVE
Protein, ur: NEGATIVE mg/dL
Specific Gravity, Urine: 1.03 (ref 1.005–1.030)
pH: 8 (ref 5.0–8.0)

## 2019-09-13 LAB — I-STAT VENOUS BLOOD GAS, ED
Acid-Base Excess: 4 mmol/L — ABNORMAL HIGH (ref 0.0–2.0)
Bicarbonate: 29.2 mmol/L — ABNORMAL HIGH (ref 20.0–28.0)
Calcium, Ion: 1.17 mmol/L (ref 1.15–1.40)
HCT: 41 % (ref 39.0–52.0)
Hemoglobin: 13.9 g/dL (ref 13.0–17.0)
O2 Saturation: 95 %
Potassium: 4 mmol/L (ref 3.5–5.1)
Sodium: 137 mmol/L (ref 135–145)
TCO2: 31 mmol/L (ref 22–32)
pCO2, Ven: 46.1 mmHg (ref 44.0–60.0)
pH, Ven: 7.411 (ref 7.250–7.430)
pO2, Ven: 78 mmHg — ABNORMAL HIGH (ref 32.0–45.0)

## 2019-09-13 LAB — RAPID URINE DRUG SCREEN, HOSP PERFORMED
Amphetamines: POSITIVE — AB
Barbiturates: NOT DETECTED
Benzodiazepines: NOT DETECTED
Cocaine: NOT DETECTED
Opiates: NOT DETECTED
Tetrahydrocannabinol: NOT DETECTED

## 2019-09-13 MED ORDER — THIAMINE HCL 100 MG/ML IJ SOLN
100.0000 mg | Freq: Once | INTRAMUSCULAR | Status: AC
  Administered 2019-09-14: 100 mg via INTRAVENOUS
  Filled 2019-09-13: qty 2

## 2019-09-13 MED ORDER — SODIUM CHLORIDE 0.9 % IV BOLUS
1000.0000 mL | Freq: Once | INTRAVENOUS | Status: AC
  Administered 2019-09-14: 1000 mL via INTRAVENOUS

## 2019-09-13 NOTE — ED Triage Notes (Signed)
Pt arrives via [facility]     Per girlfriend pt was acting strange, and confused, entering in and out of consciousness. Upon Fire arrival pt CBG "low". Given dextrose. On arrival pt CBG 484, alert a&ox3.   Pt poor historian, restless.

## 2019-09-13 NOTE — ED Provider Notes (Signed)
[facility] EMERGENCY DEPARTMENT Provider Note   CSN: [id number] Arrival date & time: 09/13/19  2136     History Chief Complaint  Patient presents with  . Hyperglycemia    Justin [name] is a 29 y.o. male with a past medical history of DM, elevated LFTs, alcohol use who presents today for evaluation of altered mental status.  History is primarily obtained from chart review and his male companion.  Reportedly since last night patient has been acting abnormally.  She states that he has been confused, drifting in and out of consciousness.  She states that he used ecstasy a few days ago however denies any other drug use since.  She states that he will wake up looking like he is scared twitching and shaking.  He reportedly has been taking his insulin, she states that he has eaten "everything" today including sodas, candy, cake pops and [employer].  He has been taking insulin 70/30, reportedly took 20 units with EMS.  When EMS was originally called he was hypoglycemic with a sugar in the 20s.  After IV dextrose he was then reportedly hyperglycemic into the 400s causing him to take his insulin.  His companion reports that he his having episodes when his face turns really red and his eyes are blood shot  His companion does report that he had a splinter that he stepped on with his right foot that has been red and painful for the past two days.   No reported fevers at home.   He was diagnosed with diabetes in 11/2018 when he was admitted in DKA.    Chart review shows that when he was seen at [facility] 8 days ago he was reported to use methamphetamines.   Patient is unable to provide much history, unable to answer questions with more than one word.    HPI     Past Medical History:  Diagnosis Date  . Diabetes mellitus without complication [facility])     Patient Active Problem List   Diagnosis Date Noted  . Amphetamine user ([place]) 09/14/2019  . DKA (diabetic ketoacidoses) ([place])  12/11/2018  . Elevated LFTs 12/11/2018  . Hyponatremia 12/11/2018  . Nausea & vomiting 12/11/2018    Past Surgical History:  Procedure Laterality Date  . right hand surgery.         Family History  Problem Relation Age of Onset  . Diabetes Mellitus II Neg Hx     Social History   Tobacco Use  . Smoking status: Former Research scientist (life sciences)  . Smokeless tobacco: Never Used  Substance Use Topics  . Alcohol use: Yes  . Drug use: Yes    Types: Marijuana    Home Medications Prior to Admission medications   Medication Sig Start Date End Date Taking? Authorizing Provider  insulin NPH-regular Human (NOVOLIN 70/30) (70-30) 100 UNIT/ML injection Inject 20 Units into the skin 2 (two) times daily with a meal.   Yes [provider]  blood glucose meter kit and supplies KIT Dispense based on patient and insurance preference. Use up to four times daily as directed. (FOR ICD-9 250.00, 250.01). For QAC - HS accuchecks. 12/12/18   Thurnell Lose, MD  cephALEXin (KEFLEX) 500 MG capsule Take 1 capsule (500 mg total) by mouth 4 (four) times daily. 09/14/19   Lorin Glass, PA-C  folic acid (FOLVITE) 1 MG tablet Take 1 tablet (1 mg total) by mouth daily. Patient not taking: Reported on 09/05/2019 12/13/18   Thurnell Lose, MD  insulin  aspart (NOVOLOG) 100 UNIT/ML injection Substitute to any brand approved.Before each meal 3 times a day, 140-199 - 2 units, 200-250 - 4 units, 251-299 - 6 units,  300-349 - 8 units,  350 or above 10 units. Dispense syringes and needles as needed, Ok to switch to PEN if approved. DX DM2, Code E11.65 Patient not taking: Reported on 09/05/2019 12/12/18   Thurnell Lose, MD  insulin glargine (LANTUS) 100 UNIT/ML injection Inject 0.45 mLs (45 Units total) into the skin at bedtime. Dispense insulin pen if approved, if not dispense as needed syringes and needles for 1 month supply. Can switch to Levemir. Diagnosis E 11.65. Patient not taking: Reported on 09/13/2019 09/06/19    [name], MD  Insulin Syringe-Needle U-100 25G X 1" 1 ML MISC For 4 times a day insulin SQ, 1 month supply. Diagnosis E11.65 09/06/19   [name], MD  thiamine 100 MG tablet Take 1 tablet (100 mg total) by mouth daily. Patient not taking: Reported on 09/05/2019 12/13/18   Thurnell Lose, MD    Allergies    Patient has no known allergies.  Review of Systems   Review of Systems  Unable to perform ROS: Mental status change    Physical Exam Updated Vital Signs BP 100/88   Pulse 88   Temp 98.6 F (37 C)   Resp 17   SpO2 99%   Physical Exam Vitals and nursing note reviewed.  Constitutional:      Appearance: He is well-developed. He is ill-appearing.  HENT:     Head: Normocephalic and atraumatic.     Mouth/Throat:     Mouth: Mucous membranes are moist.  Eyes:     Conjunctiva/sclera: Conjunctivae normal.  Cardiovascular:     Rate and Rhythm: Normal rate and regular rhythm.     Heart sounds: No murmur heard.   Pulmonary:     Effort: Pulmonary effort is normal. No respiratory distress.     Breath sounds: Normal breath sounds.  Abdominal:     Palpations: Abdomen is soft.     Tenderness: There is no abdominal tenderness. There is no guarding.  Musculoskeletal:     Cervical back: Neck supple.     Comments: Mild erythema present on bottom of right foot.   Skin:    General: Skin is warm and dry.  Neurological:     Cranial Nerves: No cranial nerve deficit.     Motor: No weakness.     Comments: Patient is able to be awakened, is initially confused, but then able to answer oriented to person, place and time.  He moves all 4 extremities spontaneously.    Psychiatric:     Comments: Restless in bed.      ED Results / Procedures / Treatments   Labs (all labs ordered are listed, but only abnormal results are displayed) Labs Reviewed  COMPREHENSIVE METABOLIC PANEL - Abnormal; Notable for the following components:      Result Value   Sodium 134 (*)    Chloride 97 (*)      Glucose, Bld 528 (*)    Calcium 8.7 (*)    Total Protein 6.0 (*)    Albumin 3.2 (*)    Alkaline Phosphatase 150 (*)    All other components within normal limits  URINALYSIS, ROUTINE W REFLEX MICROSCOPIC - Abnormal; Notable for the following components:   Color, Urine STRAW (*)    Glucose, UA >=500 (*)    All other components within normal limits  RAPID URINE  DRUG SCREEN, HOSP PERFORMED - Abnormal; Notable for the following components:   Amphetamines POSITIVE (*)    All other components within normal limits  AMMONIA - Abnormal; Notable for the following components:   Ammonia 51 (*)    All other components within normal limits  ACETAMINOPHEN LEVEL - Abnormal; Notable for the following components:   Acetaminophen (Tylenol), Serum <10 (*)    All other components within normal limits  I-STAT VENOUS BLOOD GAS, ED - Abnormal; Notable for the following components:   pO2, Ven 78.0 (*)    Bicarbonate 29.2 (*)    Acid-Base Excess 4.0 (*)    All other components within normal limits  CBG MONITORING, ED - Abnormal; Notable for the following components:   Glucose-Capillary 237 (*)    All other components within normal limits  CBC WITH DIFFERENTIAL/PLATELET  BETA-HYDROXYBUTYRIC ACID  ETHANOL    EKG EKG Interpretation  Date/Time:  Tuesday September 13 2019 23:29:43 EDT Ventricular Rate:  78 PR Interval:    QRS Duration: 103 QT Interval:  399 QTC Calculation: 455 R Axis:   71 Text Interpretation: Sinus rhythm Normal ECG When compared with ECG of When compared with ECG of 09/05/2019, QT has shortened Confirmed by Delora Fuel ([id number]) on 09/13/2019 11:36:51 PM   Radiology CT Head Wo Contrast  Result Date: 09/13/2019 CLINICAL DATA:  Confusion, encephalopathy EXAM: CT HEAD WITHOUT CONTRAST TECHNIQUE: Contiguous axial images were obtained from the base of the skull through the vertex without intravenous contrast. COMPARISON:  None. FINDINGS: Brain: No acute infarct or hemorrhage. Lateral  ventricles and midline structures are unremarkable. No acute extra-axial fluid collections. No mass effect. Vascular: No hyperdense vessel or unexpected calcification. Skull: Normal. Negative for fracture or focal lesion. Sinuses/Orbits: No acute finding. Other: None. IMPRESSION: 1. No acute intracranial process. Electronically Signed   By: Randa Ngo M.D.   On: 09/13/2019 23:15   DG Foot Complete Right  Result Date: 09/13/2019 CLINICAL DATA:  Wound on the bottom of foot EXAM: RIGHT FOOT COMPLETE - 3+ VIEW COMPARISON:  None. FINDINGS: There is no evidence of fracture or dislocation. There is no evidence of arthropathy or other focal bone abnormality. Tiny focal area of ulceration seen on the plantar surface of the calcaneus with overlying soft tissue swelling. No subcutaneous emphysema is seen. IMPRESSION: No acute osseous abnormality. Tiny area of superficial ulceration with tissue swelling overlying the heel pad. Electronically Signed   By: Prudencio Pair M.D.   On: 09/13/2019 22:51    Procedures Procedures (including critical care time)  Medications Ordered in ED Medications  Tdap (BOOSTRIX) injection 0.5 mL (has no administration in time range)  cephALEXin (KEFLEX) capsule 500 mg (has no administration in time range)  thiamine (B-1) injection 100 mg (100 mg Intravenous Given 09/14/19 0004)  sodium chloride 0.9 % bolus 1,000 mL (0 mLs Intravenous Stopped 09/14/19 0159)  sodium chloride 0.9 % bolus 1,000 mL (1,000 mLs Intravenous New Bag/Given 09/14/19 0159)    ED Course  I have reviewed the triage vital signs and the nursing notes.  Pertinent labs & imaging results that were available during my care of the patient were reviewed by me and considered in my medical decision making (see chart for details).  Clinical Course as of Sep 13 340  Wed Sep 14, 2019  0250 Patient reevaluated, he endorses male companion are both resting in the bed.  He reports he feels better.  He is unsure when his  last tetanus shot was, and for foot  infection from the splinter that he states he removed will give him Tdap, Keflex.  He was not wearing shoes when it happened.  We discussed that I suspect his high carb diet is related to his blood sugar swings.    [EH]  0320 Approx time.  Patient was able to ambulate out of the department with out difficulty, yelling irate.     [EH]    Clinical Course User Index [EH] Ollen Gross   MDM Rules/Calculators/A&P                         Jacorey Donaway is a 29 year old man who is here today with reportedly low sugar.  He was given IV glucose by EMS.  Reportedly  He then took his insulin in the ambulance on the way here after he was noted to be hyperglycemic.  His companion reports he has been eating a lot of high carb foods and not acting normal.    Labs obtained and reviewed, CMP is significant for hyperglycemia at 528.  He reportedly took insulin shortly prior to arrival.  VBG without significant acidosis.  Beta hydroxybutyric acid is not elevated, UA without ketones, no evidence of DKA.  UDS is positive for amphetamines.  UA does show over 500 glucose consistent with his hyperglycemia state.  CBC is unremarkable.  Acetaminophen and ethanol are negative.  Ammonia is minimally elevated at 51 however AST and ALT are normal.  CT head was obtained without evidence of acute abnormality.  Patient was observed in the emergency room for over 5 hours, during this time he was resting comfortably.  He was given IV fluids.  After IV fluids and a long time he was reportedly given insulin take effect repeat CBG 237.  I reevaluated patient, he reported that he felt better, was awake and alert and able to answer questions without difficulty.  Will place on keflex, give tdap for foot wound.  He was offered Warden/ranger (including shelter) and refused.   At time of discharge patient is agreeable for discharge.  After discharge RN came to me as patient was requesting help  getting insulin, however he and his male companion told me that he had his insulin.  Chart review shows last time he was here a week ago and requested help getting insulin he was allowed to spend the night in the ER until social work arrived in the morning.  Given that he told me he has insulin no indication for repeat case management involvement.  Patient was able to ambulate out of the department loudly yelling profanity with out slurred speech, difficulty ambulating or physical distress.   Return precautions were discussed with patient who states their understanding.    Note: Portions of this report may have been transcribed using voice recognition software. Every effort was made to ensure accuracy; however, inadvertent computerized transcription errors may be present Final Clinical Impression(s) / ED Diagnoses Final diagnoses:  Hyperglycemia  Right foot pain  Amphetamine user [facility])    Rx / DC Orders ED Discharge Orders         Ordered    cephALEXin (KEFLEX) 500 MG capsule  4 times daily     Discontinue  Reprint     09/14/19 0255           Lorin Glass, PA-C 36/14/43 1540    Delora Fuel, MD 08/67/61 [phone]

## 2019-09-14 ENCOUNTER — Emergency Department (HOSPITAL_COMMUNITY)
Admission: EM | Admit: 2019-09-14 | Discharge: 2019-09-14 | Discharge status: Against Medical Advice | Payer: Medicaid Other

## 2019-09-14 DIAGNOSIS — F1590 Other stimulant use, unspecified, uncomplicated: Secondary | ICD-10-CM | POA: Diagnosis present

## 2019-09-14 LAB — CBC WITH DIFFERENTIAL/PLATELET
Abs Immature Granulocytes: 0.01 10*3/uL (ref 0.00–0.07)
Basophils Absolute: 0 10*3/uL (ref 0.0–0.1)
Basophils Relative: 1 %
Eosinophils Absolute: 0.3 10*3/uL (ref 0.0–0.5)
Eosinophils Relative: 5 %
HCT: 42.8 % (ref 39.0–52.0)
Hemoglobin: 14 g/dL (ref 13.0–17.0)
Immature Granulocytes: 0 %
Lymphocytes Relative: 38 %
Lymphs Abs: 2.5 10*3/uL (ref 0.7–4.0)
MCH: 31.1 pg (ref 26.0–34.0)
MCHC: 32.7 g/dL (ref 30.0–36.0)
MCV: 95.1 fL (ref 80.0–100.0)
Monocytes Absolute: 0.9 10*3/uL (ref 0.1–1.0)
Monocytes Relative: 13 %
Neutro Abs: 2.9 10*3/uL (ref 1.7–7.7)
Neutrophils Relative %: 43 %
Platelets: 354 10*3/uL (ref 150–400)
RBC: 4.5 MIL/uL (ref 4.22–5.81)
RDW: 12.2 % (ref 11.5–15.5)
WBC: 6.6 10*3/uL (ref 4.0–10.5)
nRBC: 0 % (ref 0.0–0.2)

## 2019-09-14 LAB — COMPREHENSIVE METABOLIC PANEL
ALT: 24 U/L (ref 0–44)
AST: 31 U/L (ref 15–41)
Albumin: 3.2 g/dL — ABNORMAL LOW (ref 3.5–5.0)
Alkaline Phosphatase: 150 U/L — ABNORMAL HIGH (ref 38–126)
Anion gap: 13 (ref 5–15)
BUN: 9 mg/dL (ref 6–20)
CO2: 24 mmol/L (ref 22–32)
Calcium: 8.7 mg/dL — ABNORMAL LOW (ref 8.9–10.3)
Chloride: 97 mmol/L — ABNORMAL LOW (ref 98–111)
Creatinine, Ser: 0.83 mg/dL (ref 0.61–1.24)
GFR calc Af Amer: >60 mL/min (ref >60)
GFR calc non Af Amer: >60 mL/min (ref >60)
Glucose, Bld: 528 mg/dL (ref 70–99)
Potassium: 3.9 mmol/L (ref 3.5–5.1)
Sodium: 134 mmol/L — ABNORMAL LOW (ref 135–145)
Total Bilirubin: 0.6 mg/dL (ref 0.3–1.2)
Total Protein: 6 g/dL — ABNORMAL LOW (ref 6.5–8.1)

## 2019-09-14 LAB — ETHANOL: Alcohol, Ethyl (B): <10 mg/dL (ref <10)

## 2019-09-14 LAB — CBG MONITORING, ED
Glucose-Capillary: 484 mg/dL — ABNORMAL HIGH (ref 70–99)
Glucose-Capillary: 237 mg/dL — ABNORMAL HIGH (ref 70–99)

## 2019-09-14 LAB — BETA-HYDROXYBUTYRIC ACID: Beta-Hydroxybutyric Acid: 0.11 mmol/L (ref 0.05–0.27)

## 2019-09-14 LAB — AMMONIA: Ammonia: 51 umol/L — ABNORMAL HIGH (ref 9–35)

## 2019-09-14 LAB — ACETAMINOPHEN LEVEL: Acetaminophen (Tylenol), Serum: <10 ug/mL — ABNORMAL LOW (ref 10–30)

## 2019-09-14 MED ORDER — CEPHALEXIN 250 MG PO CAPS
500.0000 mg | ORAL_CAPSULE | Freq: Once | ORAL | Status: AC
  Administered 2019-09-14: 500 mg via ORAL
  Filled 2019-09-14: qty 2

## 2019-09-14 MED ORDER — TETANUS-DIPHTH-ACELL PERTUSSIS 5-2.5-18.5 LF-MCG/0.5 IM SUSP
0.5000 mL | Freq: Once | INTRAMUSCULAR | Status: DC
  Filled 2019-09-14: qty 0.5

## 2019-09-14 MED ORDER — SODIUM CHLORIDE 0.9 % IV BOLUS
1000.0000 mL | Freq: Once | INTRAVENOUS | Status: AC
  Administered 2019-09-14: 1000 mL via INTRAVENOUS

## 2019-09-14 MED ORDER — CEPHALEXIN 500 MG PO CAPS
500.0000 mg | ORAL_CAPSULE | Freq: Four times a day (QID) | ORAL | 0 refills | Status: DC
Start: 2019-09-14 — End: 2019-09-19

## 2019-09-14 NOTE — ED Notes (Signed)
Pt extremely agitated when provided with discharge instructions. Pt promptly stood up from bed and began throwing items around the room. This RN went over discharge instructions with both the Pt's visitor (proclaimed wife) and pt himself. Pt expressed concerns regarding insulin, stating that he does not have access to insulin and that it doesn't work.   Pt earlier confessed to taking insulin earlier before he arrived in the emergency department.   Pt exclaimed to this RN "you are a fucking retard. It is amazing what passes for a medical professional these days. You are wrong for waking me up at 3 in the fucking morning, my pancrease doesn't work, and I am not fucking mobile."   Note that pt was able to ambulate briskly to from bathroom with no impairment of gait

## 2019-09-14 NOTE — Discharge Instructions (Addendum)
Please continue to check your sugar and take your insulin.    You may have diarrhea from the antibiotics.  It is very important that you continue to take the antibiotics even if you get diarrhea unless a medical professional tells you that you may stop taking them.  If you stop too early the bacteria you are being treated for will become stronger and you may need different, more powerful antibiotics that have more side effects and worsening diarrhea.  Please stay well hydrated and consider probiotics as they may decrease the severity of your diarrhea.   Please follow up with the bone doctor for your foot.
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
## 2019-09-14 NOTE — ED Notes (Signed)
Pt named called for triage, no response. Looked for patient outside, also no response

## 2019-09-15 ENCOUNTER — Other Ambulatory Visit: Payer: Self-pay

## 2019-09-15 ENCOUNTER — Encounter (HOSPITAL_COMMUNITY): Payer: Self-pay | Admitting: Emergency Medicine

## 2019-09-15 ENCOUNTER — Emergency Department (HOSPITAL_COMMUNITY)
Admission: EM | Admit: 2019-09-15 | Discharge: 2019-09-15 | Disposition: A | Discharge status: Home / Self Care | Payer: Self-pay | Attending: Emergency Medicine | Admitting: Emergency Medicine

## 2019-09-15 DIAGNOSIS — R739 Hyperglycemia, unspecified: Secondary | ICD-10-CM

## 2019-09-15 DIAGNOSIS — Z794 Long term (current) use of insulin: Secondary | ICD-10-CM | POA: Insufficient documentation

## 2019-09-15 DIAGNOSIS — E1165 Type 2 diabetes mellitus with hyperglycemia: Secondary | ICD-10-CM | POA: Insufficient documentation

## 2019-09-15 DIAGNOSIS — Z87891 Personal history of nicotine dependence: Secondary | ICD-10-CM | POA: Insufficient documentation

## 2019-09-15 LAB — URINALYSIS, ROUTINE W REFLEX MICROSCOPIC
Bacteria, UA: NONE SEEN
Bilirubin Urine: NEGATIVE
Glucose, UA: >=500 mg/dL — AB
Hgb urine dipstick: NEGATIVE
Ketones, ur: 20 mg/dL — AB
Leukocytes,Ua: NEGATIVE
Nitrite: NEGATIVE
Protein, ur: NEGATIVE mg/dL
Specific Gravity, Urine: 1.031 — ABNORMAL HIGH (ref 1.005–1.030)
pH: 6 (ref 5.0–8.0)

## 2019-09-15 LAB — CBC
HCT: 45.2 % (ref 39.0–52.0)
Hemoglobin: 15.1 g/dL (ref 13.0–17.0)
MCH: 31.1 pg (ref 26.0–34.0)
MCHC: 33.4 g/dL (ref 30.0–36.0)
MCV: 93 fL (ref 80.0–100.0)
Platelets: 385 10*3/uL (ref 150–400)
RBC: 4.86 MIL/uL (ref 4.22–5.81)
RDW: 11.8 % (ref 11.5–15.5)
WBC: 6.8 10*3/uL (ref 4.0–10.5)
nRBC: 0 % (ref 0.0–0.2)

## 2019-09-15 LAB — BASIC METABOLIC PANEL
Anion gap: 12 (ref 5–15)
BUN: 16 mg/dL (ref 6–20)
CO2: 24 mmol/L (ref 22–32)
Calcium: 9.3 mg/dL (ref 8.9–10.3)
Chloride: 94 mmol/L — ABNORMAL LOW (ref 98–111)
Creatinine, Ser: 0.88 mg/dL (ref 0.61–1.24)
GFR calc Af Amer: >60 mL/min (ref >60)
GFR calc non Af Amer: >60 mL/min (ref >60)
Glucose, Bld: 549 mg/dL (ref 70–99)
Potassium: 4.6 mmol/L (ref 3.5–5.1)
Sodium: 130 mmol/L — ABNORMAL LOW (ref 135–145)

## 2019-09-15 LAB — CBG MONITORING, ED
Glucose-Capillary: 493 mg/dL — ABNORMAL HIGH (ref 70–99)
Glucose-Capillary: 357 mg/dL — ABNORMAL HIGH (ref 70–99)

## 2019-09-15 MED ORDER — INSULIN GLARGINE 100 UNIT/ML ~~LOC~~ SOLN: 45.0000 [IU] | Freq: Every day | SUBCUTANEOUS | 0 refills | Status: DC

## 2019-09-15 MED ORDER — INSULIN ASPART 100 UNIT/ML ~~LOC~~ SOLN: SUBCUTANEOUS | 0 refills | Status: DC

## 2019-09-15 MED ORDER — INSULIN ASPART 100 UNIT/ML ~~LOC~~ SOLN: 12.0000 [IU] | Freq: Once | SUBCUTANEOUS | Status: DC

## 2019-09-15 MED ORDER — INSULIN ASPART 100 UNIT/ML ~~LOC~~ SOLN
12.0000 [IU] | Freq: Once | SUBCUTANEOUS | Status: AC
  Administered 2019-09-15: 12 [IU] via SUBCUTANEOUS

## 2019-09-15 NOTE — Discharge Planning (Signed)
Pt discharged prior to receiving Rx.  Eligible for reinstatement.
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
## 2019-09-15 NOTE — ED Notes (Signed)
Patient verbalizes understanding of discharge instructions. Opportunity for questioning and answers were provided. Pt discharged from ED. 

## 2019-09-15 NOTE — ED Provider Notes (Signed)
[place] EMERGENCY DEPARTMENT Provider Note   CSN: [id number] Arrival date & time: 09/15/19  0410     History Chief Complaint  Patient presents with  . Hyperglycemia    Justin [name] is a 29 y.o. male.  The history is provided by the patient. No language interpreter was used.  Hyperglycemia Blood sugar level PTA:  549 Severity:  Moderate Onset quality:  Gradual Timing:  Constant Progression:  Worsening Chronicity:  New Diabetes status:  Controlled with insulin Context: change in medication   Relieved by:  Nothing Ineffective treatments:  None tried Associated symptoms: no abdominal pain   Pt seen here recently for same.  Pt reports he can't find his insulin     Past Medical History:  Diagnosis Date  . Diabetes mellitus without complication [facility])     Patient Active Problem List   Diagnosis Date Noted  . Amphetamine user ([place]) 09/14/2019  . DKA (diabetic ketoacidoses) ([place]) 12/11/2018  . Elevated LFTs 12/11/2018  . Hyponatremia 12/11/2018  . Nausea & vomiting 12/11/2018    Past Surgical History:  Procedure Laterality Date  . right hand surgery.         Family History  Problem Relation Age of Onset  . Diabetes Mellitus II Neg Hx     Social History   Tobacco Use  . Smoking status: Former Research scientist (life sciences)  . Smokeless tobacco: Never Used  Substance Use Topics  . Alcohol use: Yes  . Drug use: Yes    Types: Marijuana    Home Medications Prior to Admission medications   Medication Sig Start Date End Date Taking? Authorizing Provider  blood glucose meter kit and supplies KIT Dispense based on patient and insurance preference. Use up to four times daily as directed. (FOR ICD-9 250.00, 250.01). For QAC - HS accuchecks. 12/12/18   Thurnell Lose, MD  cephALEXin (KEFLEX) 500 MG capsule Take 1 capsule (500 mg total) by mouth 4 (four) times daily. 09/14/19   Lorin Glass, PA-C  folic acid (FOLVITE) 1 MG tablet Take 1 tablet (1 mg total)  by mouth daily. Patient not taking: Reported on 09/05/2019 12/13/18   Thurnell Lose, MD  insulin aspart (NOVOLOG) 100 UNIT/ML injection Substitute to any brand approved.Before each meal 3 times a day, 140-199 - 2 units, 200-250 - 4 units, 251-299 - 6 units,  300-349 - 8 units,  350 or above 10 units. Dispense syringes and needles as needed, Ok to switch to PEN if approved. DX DM2, Code E11.65 Patient not taking: Reported on 09/05/2019 12/12/18   Thurnell Lose, MD  insulin glargine (LANTUS) 100 UNIT/ML injection Inject 0.45 mLs (45 Units total) into the skin at bedtime. Dispense insulin pen if approved, if not dispense as needed syringes and needles for 1 month supply. Can switch to Levemir. Diagnosis E 11.65. Patient not taking: Reported on 09/13/2019 09/06/19   [name], MD  insulin NPH-regular Human (NOVOLIN 70/30) (70-30) 100 UNIT/ML injection Inject 20 Units into the skin 2 (two) times daily with a meal.    [provider]  Insulin Syringe-Needle U-100 25G X 1" 1 ML MISC For 4 times a day insulin SQ, 1 month supply. Diagnosis E11.65 09/06/19   [name], MD  thiamine 100 MG tablet Take 1 tablet (100 mg total) by mouth daily. Patient not taking: Reported on 09/05/2019 12/13/18   Thurnell Lose, MD    Allergies    Patient has no known allergies.  Review of Systems  Review of Systems  Gastrointestinal: Negative for abdominal pain.  All other systems reviewed and are negative.   Physical Exam Updated Vital Signs BP 110/72 (BP Location: Left Arm)   Pulse 69   Temp (!) 97.5 F (36.4 C) (Oral)   Resp 16   Ht 5' 8" (1.727 m)   Wt 68 kg   SpO2 100%   BMI 22.81 kg/m   Physical Exam Vitals and nursing note reviewed.  Constitutional:      Appearance: He is well-developed.  HENT:     Head: Normocephalic and atraumatic.     Right Ear: Tympanic membrane normal.     Left Ear: Tympanic membrane normal.     Nose: Nose normal.     Mouth/Throat:     Mouth: Mucous  membranes are moist.  Eyes:     Conjunctiva/sclera: Conjunctivae normal.  Cardiovascular:     Rate and Rhythm: Normal rate and regular rhythm.     Heart sounds: No murmur heard.   Pulmonary:     Effort: Pulmonary effort is normal. No respiratory distress.     Breath sounds: Normal breath sounds.  Abdominal:     Palpations: Abdomen is soft.     Tenderness: There is no abdominal tenderness.  Musculoskeletal:        General: Normal range of motion.     Cervical back: Neck supple.  Skin:    General: Skin is warm and dry.  Neurological:     General: No focal deficit present.     Mental Status: He is alert.  Psychiatric:        Mood and Affect: Mood normal.     ED Results / Procedures / Treatments   Labs (all labs ordered are listed, but only abnormal results are displayed) Labs Reviewed  BASIC METABOLIC PANEL - Abnormal; Notable for the following components:      Result Value   Sodium 130 (*)    Chloride 94 (*)    Glucose, Bld 549 (*)    All other components within normal limits  URINALYSIS, ROUTINE W REFLEX MICROSCOPIC - Abnormal; Notable for the following components:   Color, Urine STRAW (*)    Specific Gravity, Urine 1.031 (*)    Glucose, UA >=500 (*)    Ketones, ur 20 (*)    All other components within normal limits  CBG MONITORING, ED - Abnormal; Notable for the following components:   Glucose-Capillary 493 (*)    All other components within normal limits  CBC    EKG None  Radiology CT Head Wo Contrast  Result Date: 09/13/2019 CLINICAL DATA:  Confusion, encephalopathy EXAM: CT HEAD WITHOUT CONTRAST TECHNIQUE: Contiguous axial images were obtained from the base of the skull through the vertex without intravenous contrast. COMPARISON:  None. FINDINGS: Brain: No acute infarct or hemorrhage. Lateral ventricles and midline structures are unremarkable. No acute extra-axial fluid collections. No mass effect. Vascular: No hyperdense vessel or unexpected calcification.  Skull: Normal. Negative for fracture or focal lesion. Sinuses/Orbits: No acute finding. Other: None. IMPRESSION: 1. No acute intracranial process. Electronically Signed   By: Randa Ngo M.D.   On: 09/13/2019 23:15   DG Foot Complete Right  Result Date: 09/13/2019 CLINICAL DATA:  Wound on the bottom of foot EXAM: RIGHT FOOT COMPLETE - 3+ VIEW COMPARISON:  None. FINDINGS: There is no evidence of fracture or dislocation. There is no evidence of arthropathy or other focal bone abnormality. Tiny focal area of ulceration seen on the plantar surface of the  calcaneus with overlying soft tissue swelling. No subcutaneous emphysema is seen. IMPRESSION: No acute osseous abnormality. Tiny area of superficial ulceration with tissue swelling overlying the heel pad. Electronically Signed   By: Prudencio Pair M.D.   On: 09/13/2019 22:51    Procedures Procedures (including critical care time)  Medications Ordered in ED Medications  insulin aspart (novoLOG) injection 12 Units (has no administration in time range)    ED Course  I have reviewed the triage vital signs and the nursing notes.  Pertinent labs & imaging results that were available during my care of the patient were reviewed by me and considered in my medical decision making (see chart for details).    MDM Rules/Calculators/A&P                          MDM: Pt given insulin.  Pt advised to follow up with wellness  Final Clinical Impression(s) / ED Diagnoses Final diagnoses:  Hyperglycemia    Rx / DC Orders ED Discharge Orders    None    An After Visit Summary was printed and given to the patient.   Fransico Meadow, [place] 09/15/19 3845    Ripley Fraise, MD 09/16/19 [phone]

## 2019-09-16 ENCOUNTER — Inpatient Hospital Stay (HOSPITAL_COMMUNITY)
Admission: EM | Admit: 2019-09-16 | Discharge: 2019-09-19 | DRG: 637 | Disposition: A | Discharge status: Home / Self Care | Payer: Medicaid - Out of State | Attending: Internal Medicine | Admitting: Internal Medicine

## 2019-09-16 ENCOUNTER — Encounter (HOSPITAL_COMMUNITY): Payer: Self-pay | Admitting: Pediatrics

## 2019-09-16 DIAGNOSIS — K859 Acute pancreatitis without necrosis or infection, unspecified: Secondary | ICD-10-CM | POA: Diagnosis present

## 2019-09-16 DIAGNOSIS — E1310 Other specified diabetes mellitus with ketoacidosis without coma: Secondary | ICD-10-CM

## 2019-09-16 DIAGNOSIS — Z794 Long term (current) use of insulin: Secondary | ICD-10-CM

## 2019-09-16 DIAGNOSIS — E1140 Type 2 diabetes mellitus with diabetic neuropathy, unspecified: Secondary | ICD-10-CM | POA: Diagnosis present

## 2019-09-16 DIAGNOSIS — E1110 Type 2 diabetes mellitus with ketoacidosis without coma: Secondary | ICD-10-CM | POA: Diagnosis not present

## 2019-09-16 DIAGNOSIS — K219 Gastro-esophageal reflux disease without esophagitis: Secondary | ICD-10-CM | POA: Diagnosis present

## 2019-09-16 DIAGNOSIS — IMO0002 Reserved for concepts with insufficient information to code with codable children: Secondary | ICD-10-CM

## 2019-09-16 DIAGNOSIS — Z20822 Contact with and (suspected) exposure to covid-19: Secondary | ICD-10-CM | POA: Diagnosis present

## 2019-09-16 DIAGNOSIS — F17210 Nicotine dependence, cigarettes, uncomplicated: Secondary | ICD-10-CM | POA: Diagnosis present

## 2019-09-16 DIAGNOSIS — M549 Dorsalgia, unspecified: Secondary | ICD-10-CM | POA: Diagnosis not present

## 2019-09-16 LAB — BASIC METABOLIC PANEL
Anion gap: 14 (ref 5–15)
BUN: 16 mg/dL (ref 6–20)
CO2: 17 mmol/L — ABNORMAL LOW (ref 22–32)
Calcium: 8.3 mg/dL — ABNORMAL LOW (ref 8.9–10.3)
Chloride: 101 mmol/L (ref 98–111)
Creatinine, Ser: 0.99 mg/dL (ref 0.61–1.24)
GFR calc Af Amer: >60 mL/min (ref >60)
GFR calc non Af Amer: >60 mL/min (ref >60)
Glucose, Bld: 513 mg/dL (ref 70–99)
Potassium: 4.1 mmol/L (ref 3.5–5.1)
Sodium: 132 mmol/L — ABNORMAL LOW (ref 135–145)

## 2019-09-16 LAB — RAPID URINE DRUG SCREEN, HOSP PERFORMED
Amphetamines: NOT DETECTED
Barbiturates: NOT DETECTED
Benzodiazepines: NOT DETECTED
Cocaine: NOT DETECTED
Opiates: NOT DETECTED
Tetrahydrocannabinol: NOT DETECTED

## 2019-09-16 LAB — CBC WITH DIFFERENTIAL/PLATELET
Abs Immature Granulocytes: 0.02 10*3/uL (ref 0.00–0.07)
Basophils Absolute: 0.1 10*3/uL (ref 0.0–0.1)
Basophils Relative: 1 %
Eosinophils Absolute: 0.4 10*3/uL (ref 0.0–0.5)
Eosinophils Relative: 5 %
HCT: 46.1 % (ref 39.0–52.0)
Hemoglobin: 15.6 g/dL (ref 13.0–17.0)
Immature Granulocytes: 0 %
Lymphocytes Relative: 36 %
Lymphs Abs: 3.2 10*3/uL (ref 0.7–4.0)
MCH: 31.6 pg (ref 26.0–34.0)
MCHC: 33.8 g/dL (ref 30.0–36.0)
MCV: 93.3 fL (ref 80.0–100.0)
Monocytes Absolute: 0.5 10*3/uL (ref 0.1–1.0)
Monocytes Relative: 5 %
Neutro Abs: 4.8 10*3/uL (ref 1.7–7.7)
Neutrophils Relative %: 53 %
Platelets: 437 10*3/uL — ABNORMAL HIGH (ref 150–400)
RBC: 4.94 MIL/uL (ref 4.22–5.81)
RDW: 11.6 % (ref 11.5–15.5)
WBC: 9 10*3/uL (ref 4.0–10.5)
nRBC: 0 % (ref 0.0–0.2)

## 2019-09-16 LAB — URINALYSIS, ROUTINE W REFLEX MICROSCOPIC
Bacteria, UA: NONE SEEN
Bilirubin Urine: NEGATIVE
Glucose, UA: >=500 mg/dL — AB
Hgb urine dipstick: NEGATIVE
Ketones, ur: 80 mg/dL — AB
Leukocytes,Ua: NEGATIVE
Nitrite: NEGATIVE
Protein, ur: NEGATIVE mg/dL
Specific Gravity, Urine: 1.026 (ref 1.005–1.030)
pH: 5 (ref 5.0–8.0)

## 2019-09-16 LAB — COMPREHENSIVE METABOLIC PANEL
ALT: 30 U/L (ref 0–44)
AST: 26 U/L (ref 15–41)
Albumin: 3.7 g/dL (ref 3.5–5.0)
Alkaline Phosphatase: 161 U/L — ABNORMAL HIGH (ref 38–126)
Anion gap: 16 — ABNORMAL HIGH (ref 5–15)
BUN: 20 mg/dL (ref 6–20)
CO2: 17 mmol/L — ABNORMAL LOW (ref 22–32)
Calcium: 8.9 mg/dL (ref 8.9–10.3)
Chloride: 93 mmol/L — ABNORMAL LOW (ref 98–111)
Creatinine, Ser: 1.11 mg/dL (ref 0.61–1.24)
GFR calc Af Amer: >60 mL/min (ref >60)
GFR calc non Af Amer: >60 mL/min (ref >60)
Glucose, Bld: 569 mg/dL (ref 70–99)
Potassium: 4.6 mmol/L (ref 3.5–5.1)
Sodium: 126 mmol/L — ABNORMAL LOW (ref 135–145)
Total Bilirubin: 1.1 mg/dL (ref 0.3–1.2)
Total Protein: 6.8 g/dL (ref 6.5–8.1)

## 2019-09-16 LAB — I-STAT VENOUS BLOOD GAS, ED
Acid-base deficit: 5 mmol/L — ABNORMAL HIGH (ref 0.0–2.0)
Bicarbonate: 20.3 mmol/L (ref 20.0–28.0)
Calcium, Ion: 1.11 mmol/L — ABNORMAL LOW (ref 1.15–1.40)
HCT: 45 % (ref 39.0–52.0)
Hemoglobin: 15.3 g/dL (ref 13.0–17.0)
O2 Saturation: 97 %
Potassium: 4.4 mmol/L (ref 3.5–5.1)
Sodium: 130 mmol/L — ABNORMAL LOW (ref 135–145)
TCO2: 21 mmol/L — ABNORMAL LOW (ref 22–32)
pCO2, Ven: 38 mmHg — ABNORMAL LOW (ref 44.0–60.0)
pH, Ven: 7.336 (ref 7.250–7.430)
pO2, Ven: 97 mmHg — ABNORMAL HIGH (ref 32.0–45.0)

## 2019-09-16 LAB — CBG MONITORING, ED
Glucose-Capillary: 565 mg/dL (ref 70–99)
Glucose-Capillary: 569 mg/dL (ref 70–99)
Glucose-Capillary: 489 mg/dL — ABNORMAL HIGH (ref 70–99)
Glucose-Capillary: 509 mg/dL (ref 70–99)

## 2019-09-16 LAB — GLUCOSE, CAPILLARY: Glucose-Capillary: 359 mg/dL — ABNORMAL HIGH (ref 70–99)

## 2019-09-16 LAB — BETA-HYDROXYBUTYRIC ACID: Beta-Hydroxybutyric Acid: 4.99 mmol/L — ABNORMAL HIGH (ref 0.05–0.27)

## 2019-09-16 LAB — MAGNESIUM: Magnesium: 1.8 mg/dL (ref 1.7–2.4)

## 2019-09-16 LAB — LIPASE, BLOOD: Lipase: 75 U/L — ABNORMAL HIGH (ref 11–51)

## 2019-09-16 MED ORDER — SODIUM CHLORIDE 0.9 % IV SOLN: INTRAVENOUS | Status: DC

## 2019-09-16 MED ORDER — ENOXAPARIN SODIUM 40 MG/0.4ML ~~LOC~~ SOLN
40.0000 mg | Freq: Every day | SUBCUTANEOUS | Status: DC
  Administered 2019-09-17: 40 mg via SUBCUTANEOUS
  Filled 2019-09-16 (×2): qty 0.4

## 2019-09-16 MED ORDER — INSULIN REGULAR(HUMAN) IN NACL 100-0.9 UT/100ML-% IV SOLN
INTRAVENOUS | Status: DC
  Administered 2019-09-16: 14 [IU]/h via INTRAVENOUS
  Filled 2019-09-16: qty 100

## 2019-09-16 MED ORDER — INSULIN REGULAR(HUMAN) IN NACL 100-0.9 UT/100ML-% IV SOLN
INTRAVENOUS | Status: DC
  Filled 2019-09-16: qty 100

## 2019-09-16 MED ORDER — DEXTROSE 50 % IV SOLN: 0.0000 mL | INTRAVENOUS | Status: DC | PRN

## 2019-09-16 MED ORDER — POTASSIUM CHLORIDE 10 MEQ/100ML IV SOLN
10.0000 meq | INTRAVENOUS | Status: DC
  Filled 2019-09-16 (×3): qty 100

## 2019-09-16 MED ORDER — POTASSIUM CHLORIDE 10 MEQ/100ML IV SOLN
10.0000 meq | INTRAVENOUS | Status: AC
  Administered 2019-09-16 (×2): 10 meq via INTRAVENOUS

## 2019-09-16 MED ORDER — DEXTROSE-NACL 5-0.45 % IV SOLN: INTRAVENOUS | Status: DC

## 2019-09-16 MED ORDER — DEXTROSE 50 % IV SOLN
0.0000 mL | INTRAVENOUS | Status: DC | PRN
  Administered 2019-09-17: 20 mL via INTRAVENOUS
  Filled 2019-09-16: qty 50

## 2019-09-16 MED ORDER — ONDANSETRON HCL 4 MG/2ML IJ SOLN
4.0000 mg | Freq: Four times a day (QID) | INTRAMUSCULAR | Status: DC | PRN
  Administered 2019-09-19: 4 mg via INTRAVENOUS
  Filled 2019-09-16: qty 2

## 2019-09-16 MED ORDER — SODIUM CHLORIDE 0.9 % IV BOLUS
1000.0000 mL | Freq: Once | INTRAVENOUS | Status: AC
  Administered 2019-09-16: 1000 mL via INTRAVENOUS

## 2019-09-16 NOTE — H&P (Signed)
History and Physical    Justin [name] DQQ:[id number] DOB: 1990/09/13 DOA: 09/16/2019  PCP: Patient, No Pcp Per  Patient coming from: Home  I have personally briefly reviewed patient's old medical records in [place]  Chief Complaint: Hyperglycemia, abdominal pain  HPI: Justin [name] is a 29 y.o. male with medical history significant for insulin-dependent type 2 diabetes and tobacco use who presents to the ED for evaluation of hyperglycemia and abdominal pain.  Patient states he was previously taking Lantus 45 units every night with 5 to 10 units NovoLog with meals.  He says when he had this insulin he felt as if his diabetes was fairly well controlled.  He says he ran out and has been using Novolin 70/30 20 units twice a day with meals.  He feels as if this has not been controlling his blood sugar.  He does not have a way to check his blood sugar at home.  He has just been progressively feeling unwell for the last few weeks and now presenting with worsening abdominal pain, polyuria, polydipsia, tingling in his hands, worsening acid reflux, nausea, vomiting, fatigue, and intermittent diaphoresis.  He feels as if his breathing is shallow.  He came to the ED for further evaluation and management.  ED Course:  Initial vitals showed BP 118/78, pulse 81, RR 18, temp 97.6 Fahrenheit, SPO2 99% on room air.  Labs are notable for serum glucose 569, sodium 126 (137 when corrected for hyperglycemia), potassium 4.6, chloride 93, bicarb 17, BUN 20, creatinine 1.11, AST 26, ALT 30, alk phos 161, total bilirubin 1.1, anion gap 16, WBC 9.0, hemoglobin 15.6, platelets 437,000, lipase 75.  Beta hydroxy butyrate pending.  VBG showed pH 7.336, PCO2 38, PO2 97.  UDS is negative.  Urinalysis shows > 500 glucose and 80 ketones, negative nitrites, negative leukocytes, 0-5 WBC and RBCs/hpf, no bacteria on microscopy.  SARS-CoV-2 PCR was ordered and pending.  Patient was given 2 L normal saline, placed on  insulin infusion, and maintenance IV NS, and given IV K 10 mEq x 2.  The hospitalist service was consulted to admit for further evaluation management.  Review of Systems: All systems reviewed and are negative except as documented in history of present illness above.   Past Medical History:  Diagnosis Date  . Diabetes mellitus without complication [facility])     Past Surgical History:  Procedure Laterality Date  . right hand surgery.      Social History:  reports that he has quit smoking. He has never used smokeless tobacco. He reports current alcohol use. He reports current drug use. Drug: Marijuana.  No Known Allergies  Family History  Problem Relation Age of Onset  . Diabetes Mellitus II Neg Hx      Prior to Admission medications   Medication Sig Start Date End Date Taking? Authorizing Provider  blood glucose meter kit and supplies KIT Dispense based on patient and insurance preference. Use up to four times daily as directed. (FOR ICD-9 250.00, 250.01). For QAC - HS accuchecks. 12/12/18   Thurnell Lose, MD  cephALEXin (KEFLEX) 500 MG capsule Take 1 capsule (500 mg total) by mouth 4 (four) times daily. 09/14/19   Lorin Glass, PA-C  folic acid (FOLVITE) 1 MG tablet Take 1 tablet (1 mg total) by mouth daily. Patient not taking: Reported on 09/05/2019 12/13/18   Thurnell Lose, MD  insulin aspart (NOVOLOG) 100 UNIT/ML injection Substitute to any brand approved.Before each meal 3 times a day, 140-199 -  2 units, 200-250 - 4 units, 251-299 - 6 units,  300-349 - 8 units,  350 or above 10 units. Dispense syringes and needles as needed, Ok to switch to PEN if approved. DX DM2, Code E11.65 09/15/19   Fransico Meadow, PA-C  insulin glargine (LANTUS) 100 UNIT/ML injection Inject 0.45 mLs (45 Units total) into the skin at bedtime. Dispense insulin pen if approved, if not dispense as needed syringes and needles for 1 month supply. Can switch to Levemir. Diagnosis E 11.65. 09/15/19   Fransico Meadow, PA-C  insulin NPH-regular Human (NOVOLIN 70/30) (70-30) 100 UNIT/ML injection Inject 20 Units into the skin 2 (two) times daily with a meal.    [provider]  Insulin Syringe-Needle U-100 25G X 1" 1 ML MISC For 4 times a day insulin SQ, 1 month supply. Diagnosis E11.65 09/06/19   [name], MD  thiamine 100 MG tablet Take 1 tablet (100 mg total) by mouth daily. Patient not taking: Reported on 09/05/2019 12/13/18   Thurnell Lose, MD    Physical Exam: Vitals:   09/16/19 1835 09/16/19 1836  BP: 118/78   Pulse: 81   Resp: 18   Temp: 97.6 F (36.4 C)   TempSrc: Oral   SpO2: 99%   Weight:  68 kg  Height:  '5\' 8"'  (1.727 m)   Constitutional: Resting in the right lateral decubitus position in bed, NAD, calm, appears somewhat uncomfortable Eyes: PERRL, lids and conjunctivae normal ENMT: Mucous membranes are dry. Posterior pharynx clear of any exudate or lesions. Neck: normal, supple, no masses. Respiratory: clear to auscultation bilaterally, no wheezing, no crackles. Normal respiratory effort. No accessory muscle use.  Cardiovascular: Regular rate and rhythm, no murmurs / rubs / gallops. No extremity edema. 2+ pedal pulses. Abdomen: Mild generalized tenderness, no masses palpated. No hepatosplenomegaly. Bowel sounds positive.  Musculoskeletal: no clubbing / cyanosis. No joint deformity upper and lower extremities. Good ROM, no contractures. Normal muscle tone.  Skin: no rashes Neurologic: CN 2-12 grossly intact. Sensation intact, Strength 5/5 in all 4.  Psychiatric: Normal judgment and insight. Alert and oriented x 3. Normal mood.     Labs on Admission: I have personally reviewed following labs and imaging studies  CBC: Recent Labs  Lab 09/13/19 2332 09/13/19 2349 09/15/19 0433 09/16/19 1909 09/16/19 2112  WBC 6.6  --  6.8 9.0  --   NEUTROABS 2.9  --   --  4.8  --   HGB 14.0 13.9 15.1 15.6 15.3  HCT 42.8 41.0 45.2 46.1 45.0  MCV 95.1  --  93.0 93.3  --     PLT 354  --  385 437*  --    Basic Metabolic Panel: Recent Labs  Lab 09/13/19 2332 09/13/19 2349 09/15/19 0433 09/16/19 1909 09/16/19 2112  NA 134* 137 130* 126* 130*  K 3.9 4.0 4.6 4.6 4.4  CL 97*  --  94* 93*  --   CO2 24  --  24 17*  --   GLUCOSE 528*  --  549* 569*  --   BUN 9  --  16 20  --   CREATININE 0.83  --  0.88 1.11  --   CALCIUM 8.7*  --  9.3 8.9  --    GFR: Estimated Creatinine Clearance: 96.1 mL/min (by C-G formula based on SCr of 1.11 mg/dL). Liver Function Tests: Recent Labs  Lab 09/13/19 2332 09/16/19 1909  AST 31 26  ALT 24 30  ALKPHOS 150* 161*  BILITOT 0.6 1.1  PROT 6.0* 6.8  ALBUMIN 3.2* 3.7   Recent Labs  Lab 09/16/19 1909  LIPASE 75*   Recent Labs  Lab 09/13/19 2332  AMMONIA 51*   Coagulation Profile: No results for input(s): INR, PROTIME in the last 168 hours. Cardiac Enzymes: No results for input(s): CKTOTAL, CKMB, CKMBINDEX, TROPONINI in the last 168 hours. BNP (last 3 results) No results for input(s): PROBNP in the last 8760 hours. HbA1C: No results for input(s): HGBA1C in the last 72 hours. CBG: Recent Labs  Lab 09/14/19 0237 09/15/19 0424 09/15/19 0713 09/16/19 1928 09/16/19 2103  GLUCAP 237* 493* 357* 565* 569*   Lipid Profile: No results for input(s): CHOL, HDL, LDLCALC, TRIG, CHOLHDL, LDLDIRECT in the last 72 hours. Thyroid Function Tests: No results for input(s): TSH, T4TOTAL, FREET4, T3FREE, THYROIDAB in the last 72 hours. Anemia Panel: No results for input(s): VITAMINB12, FOLATE, FERRITIN, TIBC, IRON, RETICCTPCT in the last 72 hours. Urine analysis:    Component Value Date/Time   COLORURINE COLORLESS (A) 09/16/2019 1949   APPEARANCEUR CLEAR 09/16/2019 1949   LABSPEC 1.026 09/16/2019 1949   PHURINE 5.0 09/16/2019 1949   GLUCOSEU >=500 (A) 09/16/2019 1949   HGBUR NEGATIVE 09/16/2019 1949   BILIRUBINUR NEGATIVE 09/16/2019 1949   KETONESUR 80 (A) 09/16/2019 1949   PROTEINUR NEGATIVE 09/16/2019 1949    NITRITE NEGATIVE 09/16/2019 1949   LEUKOCYTESUR NEGATIVE 09/16/2019 1949    Radiological Exams on Admission: No results found.  EKG: Independently reviewed. Sinus rhythm, RAE.  Not significantly changed when compared to prior.  Assessment/Plan Principal Problem:   DKA (diabetic ketoacidoses) (HCC) Active Problems:   Insulin dependent type 2 diabetes mellitus, uncontrolled ([place])  Justin [name] is a 29 y.o. male with medical history significant for insulin-dependent type 2 diabetes and tobacco use who is admitted with DKA.  DKA in insulin-dependent type 2 diabetes: -Continue insulin infusion per protocol -Continue IV NS @ 125 mL/hr and transition to D5-1/2 NS when CBG below 250 -Follow BMET q4h overnight -Can transition to previously used Lantus 45 units plus sliding scale insulin -Counseled on diabetes coordinator and TOC for medication assistance  Tobacco use: Reports smoking 0.5 PPD, trying to cut back.  Declines nicotine patch.  DVT prophylaxis: Lovenox Code Status: Full code, confirmed with patient Family Communication: Discussed with patient, he has discussed with family Disposition Plan: From home and likely discharge to home pending improved glycemic control Consults called: None Admission status:  Status is: Observation  The patient remains OBS appropriate and will d/c before 2 midnights.  Dispo: The patient is from: Home              Anticipated d/c is to: Home              Anticipated d/c date is: 2 days              Patient currently is not medically stable to d/c.  Zada Finders MD [facility]  If 7PM-7AM, please contact night-coverage [email or web address]  09/16/2019, 9:30 PM
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
## 2019-09-17 ENCOUNTER — Observation Stay (HOSPITAL_COMMUNITY): Payer: PRIVATE HEALTH INSURANCE

## 2019-09-17 ENCOUNTER — Encounter (HOSPITAL_COMMUNITY): Payer: Self-pay | Admitting: Internal Medicine

## 2019-09-17 DIAGNOSIS — E1110 Type 2 diabetes mellitus with ketoacidosis without coma: Principal | ICD-10-CM

## 2019-09-17 LAB — BASIC METABOLIC PANEL
Anion gap: 7 (ref 5–15)
Anion gap: 8 (ref 5–15)
Anion gap: 6 (ref 5–15)
Anion gap: 12 (ref 5–15)
BUN: 12 mg/dL (ref 6–20)
BUN: 12 mg/dL (ref 6–20)
BUN: 12 mg/dL (ref 6–20)
BUN: 10 mg/dL (ref 6–20)
CO2: 23 mmol/L (ref 22–32)
CO2: 23 mmol/L (ref 22–32)
CO2: 24 mmol/L (ref 22–32)
CO2: 21 mmol/L — ABNORMAL LOW (ref 22–32)
Calcium: 8.5 mg/dL — ABNORMAL LOW (ref 8.9–10.3)
Calcium: 8.4 mg/dL — ABNORMAL LOW (ref 8.9–10.3)
Calcium: 8.3 mg/dL — ABNORMAL LOW (ref 8.9–10.3)
Calcium: 8.1 mg/dL — ABNORMAL LOW (ref 8.9–10.3)
Chloride: 107 mmol/L (ref 98–111)
Chloride: 107 mmol/L (ref 98–111)
Chloride: 106 mmol/L (ref 98–111)
Chloride: 93 mmol/L — ABNORMAL LOW (ref 98–111)
Creatinine, Ser: 0.66 mg/dL (ref 0.61–1.24)
Creatinine, Ser: 0.65 mg/dL (ref 0.61–1.24)
Creatinine, Ser: 0.62 mg/dL (ref 0.61–1.24)
Creatinine, Ser: 0.89 mg/dL (ref 0.61–1.24)
GFR calc Af Amer: >60 mL/min (ref >60)
GFR calc Af Amer: >60 mL/min (ref >60)
GFR calc Af Amer: >60 mL/min (ref >60)
GFR calc Af Amer: >60 mL/min (ref >60)
GFR calc non Af Amer: >60 mL/min (ref >60)
GFR calc non Af Amer: >60 mL/min (ref >60)
GFR calc non Af Amer: >60 mL/min (ref >60)
GFR calc non Af Amer: >60 mL/min (ref >60)
Glucose, Bld: 187 mg/dL — ABNORMAL HIGH (ref 70–99)
Glucose, Bld: 189 mg/dL — ABNORMAL HIGH (ref 70–99)
Glucose, Bld: 168 mg/dL — ABNORMAL HIGH (ref 70–99)
Glucose, Bld: 654 mg/dL (ref 70–99)
Potassium: 3.7 mmol/L (ref 3.5–5.1)
Potassium: 3.6 mmol/L (ref 3.5–5.1)
Potassium: 3.6 mmol/L (ref 3.5–5.1)
Potassium: 4 mmol/L (ref 3.5–5.1)
Sodium: 137 mmol/L (ref 135–145)
Sodium: 138 mmol/L (ref 135–145)
Sodium: 136 mmol/L (ref 135–145)
Sodium: 126 mmol/L — ABNORMAL LOW (ref 135–145)

## 2019-09-17 LAB — CBC
HCT: 42.3 % (ref 39.0–52.0)
Hemoglobin: 14.4 g/dL (ref 13.0–17.0)
MCH: 30.8 pg (ref 26.0–34.0)
MCHC: 34 g/dL (ref 30.0–36.0)
MCV: 90.6 fL (ref 80.0–100.0)
Platelets: 381 10*3/uL (ref 150–400)
RBC: 4.67 MIL/uL (ref 4.22–5.81)
RDW: 11.6 % (ref 11.5–15.5)
WBC: 7.7 10*3/uL (ref 4.0–10.5)
nRBC: 0 % (ref 0.0–0.2)

## 2019-09-17 LAB — GLUCOSE, CAPILLARY
Glucose-Capillary: 144 mg/dL — ABNORMAL HIGH (ref 70–99)
Glucose-Capillary: 159 mg/dL — ABNORMAL HIGH (ref 70–99)
Glucose-Capillary: 160 mg/dL — ABNORMAL HIGH (ref 70–99)
Glucose-Capillary: 175 mg/dL — ABNORMAL HIGH (ref 70–99)
Glucose-Capillary: 182 mg/dL — ABNORMAL HIGH (ref 70–99)
Glucose-Capillary: 190 mg/dL — ABNORMAL HIGH (ref 70–99)
Glucose-Capillary: 205 mg/dL — ABNORMAL HIGH (ref 70–99)
Glucose-Capillary: 207 mg/dL — ABNORMAL HIGH (ref 70–99)
Glucose-Capillary: 211 mg/dL — ABNORMAL HIGH (ref 70–99)
Glucose-Capillary: 224 mg/dL — ABNORMAL HIGH (ref 70–99)
Glucose-Capillary: 227 mg/dL — ABNORMAL HIGH (ref 70–99)
Glucose-Capillary: 356 mg/dL — ABNORMAL HIGH (ref 70–99)
Glucose-Capillary: 502 mg/dL (ref 70–99)
Glucose-Capillary: 535 mg/dL (ref 70–99)
Glucose-Capillary: 557 mg/dL (ref 70–99)
Glucose-Capillary: 58 mg/dL — ABNORMAL LOW (ref 70–99)
Glucose-Capillary: 88 mg/dL (ref 70–99)

## 2019-09-17 LAB — HEMOGLOBIN A1C
Hgb A1c MFr Bld: 13.7 % — ABNORMAL HIGH (ref 4.8–5.6)
Hgb A1c MFr Bld: 13.7 % — ABNORMAL HIGH (ref 4.8–5.6)
Mean Plasma Glucose: 346.49 mg/dL
Mean Plasma Glucose: 346.49 mg/dL

## 2019-09-17 LAB — SARS CORONAVIRUS 2 BY RT PCR (HOSPITAL ORDER, PERFORMED IN ~~LOC~~ HOSPITAL LAB): SARS Coronavirus 2: NEGATIVE

## 2019-09-17 MED ORDER — INSULIN ASPART 100 UNIT/ML ~~LOC~~ SOLN: 0.0000 [IU] | Freq: Three times a day (TID) | SUBCUTANEOUS | Status: DC

## 2019-09-17 MED ORDER — INSULIN ASPART 100 UNIT/ML ~~LOC~~ SOLN
20.0000 [IU] | Freq: Once | SUBCUTANEOUS | Status: AC
  Administered 2019-09-17: 20 [IU] via SUBCUTANEOUS

## 2019-09-17 MED ORDER — INSULIN ASPART 100 UNIT/ML ~~LOC~~ SOLN
15.0000 [IU] | Freq: Once | SUBCUTANEOUS | Status: AC
  Administered 2019-09-17: 15 [IU] via SUBCUTANEOUS

## 2019-09-17 MED ORDER — INSULIN ASPART 100 UNIT/ML ~~LOC~~ SOLN: 20.0000 [IU] | Freq: Once | SUBCUTANEOUS | Status: DC

## 2019-09-17 MED ORDER — INSULIN GLARGINE 100 UNIT/ML ~~LOC~~ SOLN
45.0000 [IU] | Freq: Every day | SUBCUTANEOUS | Status: DC
  Administered 2019-09-17: 45 [IU] via SUBCUTANEOUS
  Filled 2019-09-17: qty 0.45

## 2019-09-17 MED ORDER — INSULIN GLARGINE 100 UNIT/ML ~~LOC~~ SOLN
55.0000 [IU] | Freq: Every day | SUBCUTANEOUS | Status: DC
  Administered 2019-09-18: 55 [IU] via SUBCUTANEOUS
  Filled 2019-09-17: qty 0.55

## 2019-09-17 MED ORDER — PANTOPRAZOLE SODIUM 40 MG PO TBEC
40.0000 mg | DELAYED_RELEASE_TABLET | Freq: Every day | ORAL | Status: DC
  Administered 2019-09-17 – 2019-09-19 (×3): 40 mg via ORAL
  Filled 2019-09-17 (×3): qty 1

## 2019-09-17 MED ORDER — INSULIN ASPART 100 UNIT/ML ~~LOC~~ SOLN
0.0000 [IU] | SUBCUTANEOUS | Status: DC
  Administered 2019-09-18: 3 [IU] via SUBCUTANEOUS
  Administered 2019-09-18: 9 [IU] via SUBCUTANEOUS
  Administered 2019-09-18: 1 [IU] via SUBCUTANEOUS
  Administered 2019-09-18: 7 [IU] via SUBCUTANEOUS
  Administered 2019-09-18 – 2019-09-19 (×2): 3 [IU] via SUBCUTANEOUS

## 2019-09-17 MED ORDER — ACETAMINOPHEN 325 MG PO TABS
650.0000 mg | ORAL_TABLET | ORAL | Status: DC | PRN
  Administered 2019-09-17 – 2019-09-19 (×3): 650 mg via ORAL
  Filled 2019-09-17 (×3): qty 2

## 2019-09-17 MED ORDER — INSULIN ASPART 100 UNIT/ML ~~LOC~~ SOLN: 0.0000 [IU] | Freq: Every day | SUBCUTANEOUS | Status: DC

## 2019-09-17 MED ORDER — INSULIN ASPART 100 UNIT/ML ~~LOC~~ SOLN
10.0000 [IU] | Freq: Once | SUBCUTANEOUS | Status: AC
  Administered 2019-09-17: 10 [IU] via SUBCUTANEOUS

## 2019-09-17 NOTE — Progress Notes (Signed)
Increased Lantus  To 55Units daily (was 45daily)
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
## 2019-09-17 NOTE — Progress Notes (Signed)
PROGRESS NOTE    Justin [name]  LFY:[id number] DOB: 18-Apr-1990 DOA: 09/16/2019 PCP: Patient, No Pcp Per    Chief Complaint  Patient presents with  . Hyperglycemia  . Abdominal Pain    Brief Narrative:  29 year old lady with prior history of insulin-dependent diabetes mellitus and tobacco abuse presents to the ED for abdominal pain nausea and hyperglycemia. Patient states he was previously taking Lantus 45 units every night with 5 to 10 units NovoLog with meals.  He says when he had this insulin he felt as if his diabetes was fairly well controlled.  He says he ran out and has been using Novolin 70/30 20 units twice a day with meals.  He feels as if this has not been controlling his blood sugar.  He does not have a way to check his blood sugar at home.  He has just been progressively feeling unwell for the last few weeks and now presenting with worsening abdominal pain, polyuria, polydipsia, tingling in his hands, worsening acid reflux, nausea, vomiting, fatigue, and intermittent diaphoresis. On arrival to ED he was found to be in DKA.  And mild pancreatitis  Assessment & Plan:   Principal Problem:   DKA (diabetic ketoacidoses) (HCC) Active Problems:   Insulin dependent type 2 diabetes mellitus, uncontrolled (HCC)    Mild DKA Appears to have resolved. Transition IV insulin to Lantus 40 units daily.  Continue with sliding scale insulin.  Will plan to transition to 70/30 prior to discharge if he cannot get medication assistance from case management.    Insulin-dependent diabetes mellitus uncontrolled with hyperglycemia Hemoglobin A1c is 13.7.   Mild acute pancreatitis Elevated lipase levels on admission. Patient reports being nauseated, no vomiting associated with abdominal pain. Continue with IV fluids transition to soft diet and see if he can tolerate. Get ultrasound abdomen for further evaluation.   Tobacco abuse Tobacco cessation counseling provided.   DVT prophylaxis:  Lovenox Code Status: Full code Family Communication: None at bedside Disposition:   Status is: Observation  The patient will require care spanning > 2 midnights and should be moved to inpatient because: Hemodynamically unstable, Ongoing active pain requiring inpatient pain management, Ongoing diagnostic testing needed not appropriate for outpatient work up and IV treatments appropriate due to intensity of illness or inability to take PO  Dispo: The patient is from: Home              Anticipated d/c is to: Home              Anticipated d/c date is: 1 day              Patient currently is not medically stable to d/c.       Consultants:   Diabetes coordinator  Procedures: None  Antimicrobials: None   Subjective: Patient reports being nauseated, wants to eat solid food.  Objective: Vitals:   09/17/19 0411 09/17/19 0422 09/17/19 0810 09/17/19 1138  BP:  109/72 104/65 103/70  Pulse:   68 75  Resp: 15 14 13 14   Temp:  97.9 F (36.6 C) 97.7 F (36.5 C) 97.6 F (36.4 C)  TempSrc:  Oral Oral Oral  SpO2:  98% 99% 100%  Weight:      Height:        Intake/Output Summary (Last 24 hours) at 09/17/2019 1343 Last data filed at 09/17/2019 0811 Gross per 24 hour  Intake 2000 ml  Output 1600 ml  Net 400 ml   Filed Weights   09/16/19 1836  Weight: 68 kg    Examination:  General exam: Appears calm and comfortable  Respiratory system: Clear to auscultation. Respiratory effort normal. Cardiovascular system: S1 & S2 heard, RRR. No JVD,No pedal edema. Gastrointestinal system: Abdomen is nondistended, soft, mildly tender in the epigastric area, no organomegaly or masses felt. Normal bowel sounds heard. Central nervous system: Alert and oriented. No focal neurological deficits. Extremities: Symmetric 5 x 5 power. Skin: No rashes, lesions or ulcers Psychiatry:  Mood & affect appropriate.     Data Reviewed: I have personally reviewed following labs and imaging  studies  CBC: Recent Labs  Lab 09/13/19 2332 09/13/19 2332 09/13/19 2349 09/15/19 0433 09/16/19 1909 09/16/19 2112 09/17/19 0311  WBC 6.6  --   --  6.8 9.0  --  7.7  NEUTROABS 2.9  --   --   --  4.8  --   --   HGB 14.0   < > 13.9 15.1 15.6 15.3 14.4  HCT 42.8   < > 41.0 45.2 46.1 45.0 42.3  MCV 95.1  --   --  93.0 93.3  --  90.6  PLT 354  --   --  385 437*  --  381   < > = values in this interval not displayed.    Basic Metabolic Panel: Recent Labs  Lab 09/16/19 1909 09/16/19 1909 09/16/19 2112 09/16/19 2235 09/17/19 0311 09/17/19 0759 09/17/19 1051  NA 126*   < > 130* 132* 137 138 136  K 4.6   < > 4.4 4.1 3.7 3.6 3.6  CL 93*  --   --  101 107 107 106  CO2 17*  --   --  17* 23 23 24   GLUCOSE 569*  --   --  513* 187* 189* 168*  BUN 20  --   --  16 12 12 12   CREATININE 1.11  --   --  0.99 0.66 0.65 0.62  CALCIUM 8.9  --   --  8.3* 8.5* 8.4* 8.3*  MG  --   --   --  1.8  --   --   --    < > = values in this interval not displayed.    GFR: Estimated Creatinine Clearance: 133.4 mL/min (by C-G formula based on SCr of 0.62 mg/dL).  Liver Function Tests: Recent Labs  Lab 09/13/19 2332 09/16/19 1909  AST 31 26  ALT 24 30  ALKPHOS 150* 161*  BILITOT 0.6 1.1  PROT 6.0* 6.8  ALBUMIN 3.2* 3.7    CBG: Recent Labs  Lab 09/17/19 0809 09/17/19 0905 09/17/19 0933 09/17/19 1036 09/17/19 1136  GLUCAP 207* 205* 144* 175* 159*     Recent Results (from the past 240 hour(s))  SARS Coronavirus 2 by RT PCR (hospital order, performed in [facility] hospital lab) Nasopharyngeal Nasopharyngeal Swab     Status: None   Collection Time: 09/16/19 10:05 PM   Specimen: Nasopharyngeal Swab  Result Value Ref Range Status   SARS Coronavirus 2 NEGATIVE NEGATIVE Final    Comment: (NOTE) SARS-CoV-2 target nucleic acids are NOT DETECTED.  The SARS-CoV-2 RNA is generally detectable in upper and lower respiratory specimens during the acute phase of infection. The  lowest concentration of SARS-CoV-2 viral copies this assay can detect is 250 copies / mL. A negative result does not preclude SARS-CoV-2 infection and should not be used as the sole basis for treatment or other patient management decisions.  A negative result may occur with improper specimen collection / handling,  submission of specimen other than nasopharyngeal swab, presence of viral mutation(s) within the areas targeted by this assay, and inadequate number of viral copies (<250 copies / mL). A negative result must be combined with clinical observations, patient history, and epidemiological information.  Fact Sheet for Patients:   StrictlyIdeas.no  Fact Sheet for Healthcare Providers: BankingDealers.co.za  This test is not yet approved or  cleared by the [place] FDA and has been authorized for detection and/or diagnosis of SARS-CoV-2 by FDA under an Emergency Use Authorization (EUA).  This EUA will remain in effect (meaning this test can be used) for the duration of the COVID-19 declaration under Section 564(b)(1) of the Act, 21 U.S.C. section 360bbb-3(b)(1), unless the authorization is terminated or revoked sooner.  Performed at [facility], [place] [address] [place], [place] [address]          Radiology Studies: No results found.      Scheduled Meds: . enoxaparin (LOVENOX) injection  40 mg Subcutaneous QHS  . insulin aspart  0-5 Units Subcutaneous QHS  . insulin aspart  0-9 Units Subcutaneous TID WC  . insulin glargine  45 Units Subcutaneous Daily  . pantoprazole  40 mg Oral Q0600   Continuous Infusions: . sodium chloride Stopped (09/17/19 0201)  . dextrose 5 % and 0.45% NaCl Stopped (09/17/19 1242)  . insulin Stopped (09/17/19 1242)     LOS: 0 days       Hosie Poisson, MD [facility]   To contact the attending provider between 7A-7P or the covering provider during after hours 7P-7A,  please log into the web site [email or web address] and access using universal [place] password for that web site. If you do not have the password, please call the hospital operator.  09/17/2019, 1:43 PM

## 2019-09-18 ENCOUNTER — Observation Stay (HOSPITAL_COMMUNITY): Payer: PRIVATE HEALTH INSURANCE

## 2019-09-18 DIAGNOSIS — K858 Other acute pancreatitis without necrosis or infection: Secondary | ICD-10-CM

## 2019-09-18 DIAGNOSIS — E1110 Type 2 diabetes mellitus with ketoacidosis without coma: Secondary | ICD-10-CM | POA: Diagnosis not present

## 2019-09-18 DIAGNOSIS — Z794 Long term (current) use of insulin: Secondary | ICD-10-CM

## 2019-09-18 DIAGNOSIS — E1165 Type 2 diabetes mellitus with hyperglycemia: Secondary | ICD-10-CM | POA: Diagnosis not present

## 2019-09-18 LAB — GLUCOSE, CAPILLARY
Glucose-Capillary: 141 mg/dL — ABNORMAL HIGH (ref 70–99)
Glucose-Capillary: 229 mg/dL — ABNORMAL HIGH (ref 70–99)
Glucose-Capillary: 247 mg/dL — ABNORMAL HIGH (ref 70–99)
Glucose-Capillary: 249 mg/dL — ABNORMAL HIGH (ref 70–99)
Glucose-Capillary: 302 mg/dL — ABNORMAL HIGH (ref 70–99)
Glucose-Capillary: 380 mg/dL — ABNORMAL HIGH (ref 70–99)
Glucose-Capillary: 531 mg/dL (ref 70–99)

## 2019-09-18 LAB — BASIC METABOLIC PANEL
Anion gap: 8 (ref 5–15)
Anion gap: 8 (ref 5–15)
BUN: 11 mg/dL (ref 6–20)
BUN: 14 mg/dL (ref 6–20)
CO2: 27 mmol/L (ref 22–32)
CO2: 29 mmol/L (ref 22–32)
Calcium: 8.2 mg/dL — ABNORMAL LOW (ref 8.9–10.3)
Calcium: 8.7 mg/dL — ABNORMAL LOW (ref 8.9–10.3)
Chloride: 99 mmol/L (ref 98–111)
Chloride: 100 mmol/L (ref 98–111)
Creatinine, Ser: 0.69 mg/dL (ref 0.61–1.24)
Creatinine, Ser: 0.79 mg/dL (ref 0.61–1.24)
GFR calc Af Amer: >60 mL/min (ref >60)
GFR calc Af Amer: >60 mL/min (ref >60)
GFR calc non Af Amer: >60 mL/min (ref >60)
GFR calc non Af Amer: >60 mL/min (ref >60)
Glucose, Bld: 401 mg/dL — ABNORMAL HIGH (ref 70–99)
Glucose, Bld: 370 mg/dL — ABNORMAL HIGH (ref 70–99)
Potassium: 3.8 mmol/L (ref 3.5–5.1)
Potassium: 4.3 mmol/L (ref 3.5–5.1)
Sodium: 134 mmol/L — ABNORMAL LOW (ref 135–145)
Sodium: 137 mmol/L (ref 135–145)

## 2019-09-18 MED ORDER — INSULIN ASPART 100 UNIT/ML ~~LOC~~ SOLN
15.0000 [IU] | Freq: Once | SUBCUTANEOUS | Status: AC
  Administered 2019-09-18: 15 [IU] via SUBCUTANEOUS

## 2019-09-18 MED ORDER — ZOLPIDEM TARTRATE 5 MG PO TABS
5.0000 mg | ORAL_TABLET | Freq: Every evening | ORAL | Status: DC | PRN
  Administered 2019-09-18: 5 mg via ORAL
  Filled 2019-09-18: qty 1

## 2019-09-18 MED ORDER — INSULIN GLARGINE 100 UNIT/ML ~~LOC~~ SOLN: 60.0000 [IU] | Freq: Every day | SUBCUTANEOUS | Status: DC

## 2019-09-18 MED ORDER — INSULIN ASPART PROT & ASPART (70-30 MIX) 100 UNIT/ML ~~LOC~~ SUSP
30.0000 [IU] | Freq: Two times a day (BID) | SUBCUTANEOUS | Status: DC
  Filled 2019-09-18: qty 10

## 2019-09-18 MED ORDER — INSULIN GLARGINE 100 UNIT/ML ~~LOC~~ SOLN
60.0000 [IU] | Freq: Every day | SUBCUTANEOUS | Status: DC
  Administered 2019-09-18: 60 [IU] via SUBCUTANEOUS
  Filled 2019-09-18 (×2): qty 0.6

## 2019-09-18 MED ORDER — GABAPENTIN 100 MG PO CAPS
100.0000 mg | ORAL_CAPSULE | Freq: Two times a day (BID) | ORAL | Status: DC
  Administered 2019-09-18 – 2019-09-19 (×2): 100 mg via ORAL
  Filled 2019-09-18 (×2): qty 1

## 2019-09-18 MED ORDER — INSULIN ASPART 100 UNIT/ML ~~LOC~~ SOLN
5.0000 [IU] | Freq: Three times a day (TID) | SUBCUTANEOUS | Status: DC
  Administered 2019-09-18: 5 [IU] via SUBCUTANEOUS

## 2019-09-18 NOTE — Progress Notes (Signed)
Pt's blood sugar an hour after receiving 20 Units insulin is 502.  Informed the on call physician.  The physician ordered another one time subcutaneous Novolog insulin of 15 Units and to recheck the sugar an hour afterwards.  Will continue to monitor.  Harriet Masson, RN

## 2019-09-18 NOTE — Progress Notes (Signed)
Pt took himself off the heart monitor.  Stated he was tired of being tied down by wires.  Seemed a little aggressive.  Informed telemetry monitoring.  Will continue to monitor.  Harriet Masson, RN

## 2019-09-18 NOTE — Progress Notes (Signed)
Blood sugar is now 227.  Informed physician.  No new orders given.  Will continue to monitor.  Harriet Masson, RN

## 2019-09-18 NOTE — Progress Notes (Signed)
Pt's blood glucose from BMP drawn at 1852 was 654.  Informed the on call physician.  Dr ordered a one time subcutaneous 20 Units Novolog insulin and to recheck his blood sugar an hour after insulin is given.  Also, changed time to check blood sugar to every four hours starting at 2100 with a new insulin sliding scale at the same time.  Harriet Masson, RN

## 2019-09-18 NOTE — Progress Notes (Signed)
PROGRESS NOTE    Justin [name]  VOH:[id number] DOB: 11-20-1990 DOA: 09/16/2019 PCP: Patient, No Pcp Per    Chief Complaint  Patient presents with  . Hyperglycemia  . Abdominal Pain    Brief Narrative:  29 year old lady with prior history of insulin-dependent diabetes mellitus and tobacco abuse presents to the ED for abdominal pain nausea and hyperglycemia. Patient states he was previously taking Lantus 45 units every night with 5 to 10 units NovoLog with meals.  He says when he had this insulin he felt as if his diabetes was fairly well controlled.  He says he ran out and has been using Novolin 70/30 20 units twice a day with meals.  He feels as if this has not been controlling his blood sugar.  He does not have a way to check his blood sugar at home.  He has just been progressively feeling unwell for the last few weeks and now presenting with worsening abdominal pain, polyuria, polydipsia, tingling in his hands, worsening acid reflux, nausea, vomiting, fatigue, and intermittent diaphoresis. On arrival to ED he was found to be in DKA.  And mild pancreatitis  Assessment & Plan:   Principal Problem:   DKA (diabetic ketoacidoses) ([place]) Active Problems:   Insulin dependent type 2 diabetes mellitus, uncontrolled ([place])    Mild DKA Appears to have resolved. Initially transitioned to lantus, but pt reports he cannot afford to pay for the Lantus and he has already used up the medication assistance.  Continue with sliding scale insulin.  CBG (last 3)  Recent Labs    09/18/19 0614 09/18/19 0820 09/18/19 1119  GLUCAP 229* 380* 247*   Increased lantus 60 units daily.      Insulin-dependent diabetes mellitus uncontrolled with hyperglycemia Hemoglobin A1c is 13.7. Increased lantus to 60 units and requested case management consult for medication assistance.    Mild acute pancreatitis Elevated lipase levels on admission. Patient reports being nauseated, no vomiting associated with  abdominal pain on admission. Appear to have resolved. [place] abd is negative.  Pt was started of soft diet and advanced as tolerated.    Tobacco abuse Tobacco cessation counseling provided.   DVT prophylaxis: Lovenox Code Status: Full code Family Communication: None at bedside Disposition:   Status is: Observation  The patient will require care spanning > 2 midnights and should be moved to inpatient because: Hemodynamically unstable, Ongoing active pain requiring inpatient pain management, Ongoing diagnostic testing needed not appropriate for outpatient work up and IV treatments appropriate due to intensity of illness or inability to take PO  Dispo: The patient is from: Home              Anticipated d/c is to: Home              Anticipated d/c date is: 1 day              Patient currently is not medically stable to d/c.       Consultants:   Diabetes coordinator  Procedures: None  Antimicrobials: None   Subjective: Nausea has improved, no chest pain or sob.   Objective: Vitals:   09/18/19 0046 09/18/19 0500 09/18/19 0824 09/18/19 1120  BP: 125/64 102/73 (!) 97/55 105/74  Pulse: 80 68 79 84  Resp: (!) 22 19 18 16   Temp: 97.7 F (36.5 C) 97.6 F (36.4 C) 98 F (36.7 C) 97.6 F (36.4 C)  TempSrc: Oral Oral Oral Oral  SpO2: 100% 97% 97% 94%  Weight:  Height:        Intake/Output Summary (Last 24 hours) at 09/18/2019 1430 Last data filed at 09/18/2019 1122 Gross per 24 hour  Intake 1643 ml  Output 2805 ml  Net -1162 ml   Filed Weights   09/16/19 1836  Weight: 68 kg    Examination:  General exam: Alert and comfortable, not in distress Respiratory system: To auscultation bilaterally, no wheezing or rhonchi Cardiovascular system: 1 S2 heard, regular rate rhythm, no JVD, no pedal edema. Gastrointestinal system: Abdomen is soft, no nontender, nondistended, bowel sounds normal Central nervous system: Alert and oriented, grossly nonfocal Extremities: No  pedal edema Skin: No rashes Psychiatry: Mood is appropriate    Data Reviewed: I have personally reviewed following labs and imaging studies  CBC: Recent Labs  Lab 09/13/19 2332 09/13/19 2332 09/13/19 2349 09/15/19 0433 09/16/19 1909 09/16/19 2112 09/17/19 0311  WBC 6.6  --   --  6.8 9.0  --  7.7  NEUTROABS 2.9  --   --   --  4.8  --   --   HGB 14.0   < > 13.9 15.1 15.6 15.3 14.4  HCT 42.8   < > 41.0 45.2 46.1 45.0 42.3  MCV 95.1  --   --  93.0 93.3  --  90.6  PLT 354  --   --  385 437*  --  381   < > = values in this interval not displayed.    Basic Metabolic Panel: Recent Labs  Lab 09/16/19 2235 09/16/19 2235 09/17/19 0311 09/17/19 0759 09/17/19 1051 09/17/19 1852 09/18/19 0752  NA 132*   < > 137 138 136 126* 134*  K 4.1   < > 3.7 3.6 3.6 4.0 3.8  CL 101   < > 107 107 106 93* 99  CO2 17*   < > 23 23 24  21* 27  GLUCOSE 513*   < > 187* 189* 168* 654* 401*  BUN 16   < > 12 12 12 10 11   CREATININE 0.99   < > 0.66 0.65 0.62 0.89 0.69  CALCIUM 8.3*   < > 8.5* 8.4* 8.3* 8.1* 8.2*  MG 1.8  --   --   --   --   --   --    < > = values in this interval not displayed.    GFR: Estimated Creatinine Clearance: 133.4 mL/min (by C-G formula based on SCr of 0.69 mg/dL).  Liver Function Tests: Recent Labs  Lab 09/13/19 2332 09/16/19 1909  AST 31 26  ALT 24 30  ALKPHOS 150* 161*  BILITOT 0.6 1.1  PROT 6.0* 6.8  ALBUMIN 3.2* 3.7    CBG: Recent Labs  Lab 09/18/19 0054 09/18/19 0459 09/18/19 0614 09/18/19 0820 09/18/19 1119  GLUCAP 141* 249* 229* 380* 247*     Recent Results (from the past 240 hour(s))  SARS Coronavirus 2 by RT PCR (hospital order, performed in [facility] hospital lab) Nasopharyngeal Nasopharyngeal Swab     Status: None   Collection Time: 09/16/19 10:05 PM   Specimen: Nasopharyngeal Swab  Result Value Ref Range Status   SARS Coronavirus 2 NEGATIVE NEGATIVE Final    Comment: (NOTE) SARS-CoV-2 target nucleic acids are NOT DETECTED.  The  SARS-CoV-2 RNA is generally detectable in upper and lower respiratory specimens during the acute phase of infection. The lowest concentration of SARS-CoV-2 viral copies this assay can detect is 250 copies / mL. A negative result does not preclude SARS-CoV-2 infection and should not  be used as the sole basis for treatment or other patient management decisions.  A negative result may occur with improper specimen collection / handling, submission of specimen other than nasopharyngeal swab, presence of viral mutation(s) within the areas targeted by this assay, and inadequate number of viral copies (<250 copies / mL). A negative result must be combined with clinical observations, patient history, and epidemiological information.  Fact Sheet for Patients:   BoilerBrush.com.cy  Fact Sheet for Healthcare Providers: https://pope.com/  This test is not yet approved or  cleared by the [place] FDA and has been authorized for detection and/or diagnosis of SARS-CoV-2 by FDA under an Emergency Use Authorization (EUA).  This EUA will remain in effect (meaning this test can be used) for the duration of the COVID-19 declaration under Section 564(b)(1) of the Act, 21 U.S.C. section 360bbb-3(b)(1), unless the authorization is terminated or revoked sooner.  Performed at [facility], [address], [place], [place] [address]          Radiology Studies: US Abdomen Limited  Result Date: 09/18/2019 CLINICAL DATA:  Initial evaluation for acute pancreatitis. EXAM: ULTRASOUND ABDOMEN LIMITED RIGHT UPPER QUADRANT COMPARISON:  None. FINDINGS: Gallbladder: No gallstones or wall thickening visualized. No sonographic Murphy sign noted by sonographer. Common bile duct: Diameter: 2.1 mm. Liver: No focal lesion identified. Within normal limits in parenchymal echogenicity. Portal vein is patent on color Doppler imaging with normal direction of blood flow  towards the liver. Other: None. IMPRESSION: Normal right upper quadrant ultrasound. No cholelithiasis or biliary dilatation. Electronically Signed   By: Rise Mu M.D.   On: 09/18/2019 07:51        Scheduled Meds: . enoxaparin (LOVENOX) injection  40 mg Subcutaneous QHS  . insulin aspart  0-5 Units Subcutaneous QHS  . insulin aspart  0-9 Units Subcutaneous Q4H  . insulin aspart  5 Units Subcutaneous TID WC  . [START ON 09/19/2019] insulin aspart protamine- aspart  30 Units Subcutaneous BID WC  . pantoprazole  40 mg Oral Q0600   Continuous Infusions: . sodium chloride Stopped (09/17/19 0201)     LOS: 0 days       Kathlen Mody, MD [facility]   To contact the attending provider between 7A-7P or the covering provider during after hours 7P-7A, please log into the web site [email or web address] and access using universal [place] password for that web site. If you do not have the password, please call the hospital operator.  09/18/2019, 2:30 PM

## 2019-09-18 NOTE — Progress Notes (Signed)
Pt refused morning insulin because he says he never takes insulin without food.  Nurse explained he was only going to get 3 Units of Novolog insulin and he needed to have an empty stomach for his abdominal ultrasound this am, but he still refused.  Will continue to monitor.  Harriet Masson, RN

## 2019-09-19 ENCOUNTER — Encounter (HOSPITAL_COMMUNITY): Payer: Self-pay | Admitting: Internal Medicine

## 2019-09-19 ENCOUNTER — Other Ambulatory Visit: Payer: Self-pay

## 2019-09-19 DIAGNOSIS — K859 Acute pancreatitis without necrosis or infection, unspecified: Secondary | ICD-10-CM | POA: Diagnosis not present

## 2019-09-19 DIAGNOSIS — Z794 Long term (current) use of insulin: Secondary | ICD-10-CM | POA: Diagnosis not present

## 2019-09-19 DIAGNOSIS — E1165 Type 2 diabetes mellitus with hyperglycemia: Secondary | ICD-10-CM | POA: Diagnosis not present

## 2019-09-19 DIAGNOSIS — E1110 Type 2 diabetes mellitus with ketoacidosis without coma: Secondary | ICD-10-CM | POA: Diagnosis not present

## 2019-09-19 LAB — BASIC METABOLIC PANEL
Anion gap: 8 (ref 5–15)
BUN: 16 mg/dL (ref 6–20)
CO2: 27 mmol/L (ref 22–32)
Calcium: 8.6 mg/dL — ABNORMAL LOW (ref 8.9–10.3)
Chloride: 102 mmol/L (ref 98–111)
Creatinine, Ser: 0.59 mg/dL — ABNORMAL LOW (ref 0.61–1.24)
GFR calc Af Amer: >60 mL/min (ref >60)
GFR calc non Af Amer: >60 mL/min (ref >60)
Glucose, Bld: 241 mg/dL — ABNORMAL HIGH (ref 70–99)
Potassium: 3.9 mmol/L (ref 3.5–5.1)
Sodium: 137 mmol/L (ref 135–145)

## 2019-09-19 LAB — GLUCOSE, CAPILLARY
Glucose-Capillary: >600 mg/dL (ref 70–99)
Glucose-Capillary: 112 mg/dL — ABNORMAL HIGH (ref 70–99)
Glucose-Capillary: 157 mg/dL — ABNORMAL HIGH (ref 70–99)
Glucose-Capillary: 216 mg/dL — ABNORMAL HIGH (ref 70–99)
Glucose-Capillary: 222 mg/dL — ABNORMAL HIGH (ref 70–99)
Glucose-Capillary: 139 mg/dL — ABNORMAL HIGH (ref 70–99)
Glucose-Capillary: 130 mg/dL — ABNORMAL HIGH (ref 70–99)

## 2019-09-19 MED ORDER — NICOTINE 14 MG/24HR TD PT24: 14.0000 mg | MEDICATED_PATCH | Freq: Every day | TRANSDERMAL | 0 refills | Status: AC

## 2019-09-19 MED ORDER — NICOTINE 14 MG/24HR TD PT24
14.0000 mg | MEDICATED_PATCH | Freq: Every day | TRANSDERMAL | Status: DC
  Administered 2019-09-19 (×2): 14 mg via TRANSDERMAL
  Filled 2019-09-19 (×2): qty 1

## 2019-09-19 MED ORDER — INSULIN GLARGINE 100 UNIT/ML ~~LOC~~ SOLN: 60.0000 [IU] | Freq: Every day | SUBCUTANEOUS | 11 refills | Status: DC

## 2019-09-19 MED ORDER — HYDROCODONE-ACETAMINOPHEN 5-325 MG PO TABS
1.0000 | ORAL_TABLET | Freq: Three times a day (TID) | ORAL | Status: DC | PRN
  Administered 2019-09-19: 1 via ORAL
  Filled 2019-09-19: qty 1

## 2019-09-19 MED ORDER — NOVOLIN 70/30 (70-30) 100 UNIT/ML ~~LOC~~ SUSP: 25.0000 [IU] | Freq: Two times a day (BID) | SUBCUTANEOUS | 11 refills | Status: DC

## 2019-09-19 MED ORDER — INSULIN ASPART 100 UNIT/ML ~~LOC~~ SOLN: SUBCUTANEOUS | 11 refills | Status: DC

## 2019-09-19 MED ORDER — PANTOPRAZOLE SODIUM 40 MG PO TBEC: 40.0000 mg | DELAYED_RELEASE_TABLET | Freq: Every day | ORAL | 0 refills | Status: AC

## 2019-09-19 MED ORDER — GABAPENTIN 100 MG PO CAPS: 200.0000 mg | ORAL_CAPSULE | Freq: Three times a day (TID) | ORAL | 0 refills | Status: AC

## 2019-09-19 MED ORDER — INSULIN ASPART 100 UNIT/ML ~~LOC~~ SOLN
7.0000 [IU] | Freq: Three times a day (TID) | SUBCUTANEOUS | Status: DC
  Administered 2019-09-19: 7 [IU] via SUBCUTANEOUS

## 2019-09-19 MED ORDER — INSULIN ASPART 100 UNIT/ML ~~LOC~~ SOLN
0.0000 [IU] | Freq: Three times a day (TID) | SUBCUTANEOUS | Status: DC
  Administered 2019-09-19: 3 [IU] via SUBCUTANEOUS
  Administered 2019-09-19 (×2): 1 [IU] via SUBCUTANEOUS

## 2019-09-19 MED ORDER — GABAPENTIN 100 MG PO CAPS
200.0000 mg | ORAL_CAPSULE | Freq: Three times a day (TID) | ORAL | Status: DC
  Administered 2019-09-19: 200 mg via ORAL
  Filled 2019-09-19: qty 2

## 2019-09-19 MED ORDER — HYDROCODONE-ACETAMINOPHEN 5-325 MG PO TABS: 1.0000 | ORAL_TABLET | Freq: Three times a day (TID) | ORAL | 0 refills | Status: AC | PRN

## 2019-09-19 MED FILL — HYDROCODON-APAP 5-325: 5-325 | 3 days supply | Qty: 9 | Fill #0

## 2019-09-19 MED FILL — GABAPENTIN 100 MG CAPSULE: 100 | 30 days supply | Qty: 90 | Fill #0

## 2019-09-19 MED FILL — PANTOPRAZOLE SOD DR 40 MG T: 40 | 30 days supply | Qty: 30 | Fill #0

## 2019-09-19 NOTE — Progress Notes (Signed)
Pt's CBG is 531. Paged MD on call received new orders for 15 units of novolog now and orders to give lantus 60 units now instead of in the morning. MD entered orders and medications administered. Educated patient about medications.

## 2019-09-19 NOTE — Progress Notes (Signed)
Patient reports anxiety and feels like he needs to smoke. He stated he smokes a pack a day and has not smoked in three days. Called MD on call and received orders for nicotine patch. Applied patch to patient's left arm and educated patient about the patch.

## 2019-09-19 NOTE — Discharge Summary (Signed)
Physician Discharge Summary  Justin [name] JAS:[id number] DOB: January 28, 1991 DOA: 09/16/2019  PCP: Patient, No Pcp Per  Admit date: 09/16/2019 Discharge date: 09/19/2019  Admitted From: Home Disposition: Home.   Recommendations for Outpatient Follow-up:  1. Follow up with PCP in 1-2 weeks 2. Please obtain BMP/CBC in one week Please follow up with endocrinology in one week.   Discharge Condition: stable. CODE STATUS : full code.  Diet recommendation: Heart Healthy   Brief/Interim Summary: 29 year old lady with prior history of insulin-dependent diabetes mellitus and tobacco abuse presents to the ED for abdominal pain nausea and hyperglycemia. Patient states he was previously taking Lantus 45 units every night with 5 to 10 units NovoLog with meals. He says when he had this insulin he felt as if his diabetes was fairly well controlled. He says he ran out and has been using Novolin 70/30 20 units twice a day with meals. He feels as if this has not been controlling his blood sugar. He does not have a way to check his blood sugar at home. He has just been progressively feeling unwell for the last few weeks and now presenting with worsening abdominal pain, polyuria, polydipsia, tingling in his hands, worsening acid reflux, nausea, vomiting, fatigue, and intermittent diaphoresis. On arrival to ED he was found to be in DKA.  And mild pancreatitis   Discharge Diagnoses:  Principal Problem:   DKA (diabetic ketoacidoses) ([place]) Active Problems:   Insulin dependent type 2 diabetes mellitus, uncontrolled ([facility])  Mild DKA Appears to have resolved. Initially transitioned to lantus, but pt reports he cannot afford to pay for the Lantus and he has already used up the medication assistance.  so we switched to Novolog 70/30 25 units BID and continue with SSI on discharge.  CBG (last 3)  Recent Labs    09/19/19 0803 09/19/19 1203 09/19/19 1516  GLUCAP 222* 139* 130*        Insulin-dependent  diabetes mellitus uncontrolled with hyperglycemia Hemoglobin A1c is 13.7. resume Novolog 70/30 , 25 units BID.     Mild back pain,  Requested NORCO for pain relief.     Diabetic neuropathy;  Resume gabapentin.     Mild acute pancreatitis Elevated lipase levels on admission. Patient reports being nauseated, no vomiting associated with abdominal pain on admission. Appear to have resolved. [place] abd is negative.  Pt was started of soft diet and advanced as tolerated.    Tobacco abuse Tobacco cessation counseling provided   Discharge Instructions  Discharge Instructions    Diet - low sodium heart healthy   Complete by: As directed    Discharge instructions   Complete by: As directed    Please follow up with PCP in one week.     Allergies as of 09/19/2019   No Known Allergies     Medication List    STOP taking these medications   cephALEXin 500 MG capsule Commonly known as: KEFLEX   insulin glargine 100 UNIT/ML injection Commonly known as: Lantus   thiamine 100 MG tablet     TAKE these medications   blood glucose meter kit and supplies Kit Dispense based on patient and insurance preference. Use up to four times daily as directed. (FOR ICD-9 250.00, 250.01). For QAC - HS accuchecks.   folic acid 1 MG tablet Commonly known as: FOLVITE Take 1 tablet (1 mg total) by mouth daily.   gabapentin 100 MG capsule Commonly known as: NEURONTIN Take 2 capsules (200 mg total) by mouth 3 (three) times daily.  HYDROcodone-acetaminophen 5-325 MG tablet Commonly known as: NORCO/VICODIN Take 1 tablet by mouth every 8 (eight) hours as needed for up to 3 days for moderate pain.   insulin aspart 100 UNIT/ML injection Commonly known as: novoLOG CBG 70 - 120: 0 units CBG 121 - 150: 1 unit CBG 151 - 200: 2 units CBG 201 - 250: 3 units CBG 251 - 300: 5 units CBG 301 - 350: 7 units CBG 351 - 400: 9 units What changed: additional instructions   Insulin Syringe-Needle  U-100 25G X 1" 1 ML Misc For 4 times a day insulin SQ, 1 month supply. Diagnosis E11.65   nicotine 14 mg/24hr patch Commonly known as: NICODERM CQ - dosed in mg/24 hours Place 1 patch (14 mg total) onto the skin daily. Start taking on: September 20, 2019   NovoLIN 70/30 (70-30) 100 UNIT/ML injection Generic drug: insulin NPH-regular Human Inject 25 Units into the skin 2 (two) times daily with a meal. What changed: how much to take   pantoprazole 40 MG tablet Commonly known as: PROTONIX Take 1 tablet (40 mg total) by mouth daily at 6 (six) AM. Start taking on: September 20, 2019       [place]. Go on 10/04/2019.   Why: f/u appointment for diabetes needs  Contact information: [address] [facility] [address] [phone]             No Known Allergies  Consultations:  none   Procedures/Studies: CT Head Wo Contrast  Result Date: 09/13/2019 CLINICAL DATA:  Confusion, encephalopathy EXAM: CT HEAD WITHOUT CONTRAST TECHNIQUE: Contiguous axial images were obtained from the base of the skull through the vertex without intravenous contrast. COMPARISON:  None. FINDINGS: Brain: No acute infarct or hemorrhage. Lateral ventricles and midline structures are unremarkable. No acute extra-axial fluid collections. No mass effect. Vascular: No hyperdense vessel or unexpected calcification. Skull: Normal. Negative for fracture or focal lesion. Sinuses/Orbits: No acute finding. Other: None. IMPRESSION: 1. No acute intracranial process. Electronically Signed   By: Randa Ngo M.D.   On: 09/13/2019 23:15   US Abdomen Limited  Result Date: 09/18/2019 CLINICAL DATA:  Initial evaluation for acute pancreatitis. EXAM: ULTRASOUND ABDOMEN LIMITED RIGHT UPPER QUADRANT COMPARISON:  None. FINDINGS: Gallbladder: No gallstones or wall thickening visualized. No sonographic Murphy sign noted by sonographer. Common bile duct:  Diameter: 2.1 mm. Liver: No focal lesion identified. Within normal limits in parenchymal echogenicity. Portal vein is patent on color Doppler imaging with normal direction of blood flow towards the liver. Other: None. IMPRESSION: Normal right upper quadrant ultrasound. No cholelithiasis or biliary dilatation. Electronically Signed   By: Jeannine Boga M.D.   On: 09/18/2019 07:51   DG Foot Complete Right  Result Date: 09/13/2019 CLINICAL DATA:  Wound on the bottom of foot EXAM: RIGHT FOOT COMPLETE - 3+ VIEW COMPARISON:  None. FINDINGS: There is no evidence of fracture or dislocation. There is no evidence of arthropathy or other focal bone abnormality. Tiny focal area of ulceration seen on the plantar surface of the calcaneus with overlying soft tissue swelling. No subcutaneous emphysema is seen. IMPRESSION: No acute osseous abnormality. Tiny area of superficial ulceration with tissue swelling overlying the heel pad. Electronically Signed   By: Prudencio Pair M.D.   On: 09/13/2019 22:51       Subjective:  No complaints today.   Discharge Exam: Vitals:   09/19/19 0805 09/19/19 1206  BP: (!) 94/58 105/62  Pulse: 88 80  Resp: 18 18  Temp: 98 F (36.7 C) 98 F (36.7 C)  SpO2: 97% 98%   Vitals:   09/19/19 0100 09/19/19 0524 09/19/19 0805 09/19/19 1206  BP: (!) 91/54 (!) 94/56 (!) 94/58 105/62  Pulse: 65 68 88 80  Resp: _0 Temp: 97.9 F (36.6 C) 97.8 F (36.6 C) 98 F (36.7 C) 98 F (36.7 C)  TempSrc: Oral Oral Oral Oral  SpO2: 98% 98% 97% 98%  Weight:      Height:        General: Pt is alert, awake, not in acute distress Cardiovascular: RRR, S1/S2 +, no rubs, no gallops Respiratory: CTA bilaterally, no wheezing, no rhonchi Abdominal: Soft, NT, ND, bowel sounds + Extremities: no edema, no cyanosis    The results of significant diagnostics from this hospitalization (including imaging, microbiology, ancillary and laboratory) are listed below for reference.      Microbiology: Recent Results (from the past 240 hour(s))  SARS Coronavirus 2 by RT PCR (hospital order, performed in [facility] hospital lab) Nasopharyngeal Nasopharyngeal Swab     Status: None   Collection Time: 09/16/19 10:05 PM   Specimen: Nasopharyngeal Swab  Result Value Ref Range Status   SARS Coronavirus 2 NEGATIVE NEGATIVE Final    Comment: (NOTE) SARS-CoV-2 target nucleic acids are NOT DETECTED.  The SARS-CoV-2 RNA is generally detectable in upper and lower respiratory specimens during the acute phase of infection. The lowest concentration of SARS-CoV-2 viral copies this assay can detect is 250 copies / mL. A negative result does not preclude SARS-CoV-2 infection and should not be used as the sole basis for treatment or other patient management decisions.  A negative result may occur with improper specimen collection / handling, submission of specimen other than nasopharyngeal swab, presence of viral mutation(s) within the areas targeted by this assay, and inadequate number of viral copies (<250 copies / mL). A negative result must be combined with clinical observations, patient history, and epidemiological information.  Fact Sheet for Patients:   StrictlyIdeas.no  Fact Sheet for Healthcare Providers: BankingDealers.co.za  This test is not yet approved or  cleared by the [place] FDA and has been authorized for detection and/or diagnosis of SARS-CoV-2 by FDA under an Emergency Use Authorization (EUA).  This EUA will remain in effect (meaning this test can be used) for the duration of the COVID-19 declaration under Section 564(b)(1) of the Act, 21 U.S.C. section 360bbb-3(b)(1), unless the authorization is terminated or revoked sooner.  Performed at [facility], [place] [address], [place], [place] [address]      Labs: BNP (last 3 results) No results for input(s): BNP in the last 8760 hours. Basic  Metabolic Panel: Recent Labs  Lab 09/16/19 2235 09/17/19 0311 09/17/19 1051 09/17/19 1852 09/18/19 0752 09/18/19 1803 09/19/19 0618  NA 132*   < > 136 126* 134* 137 137  K 4.1   < > 3.6 4.0 3.8 4.3 3.9  CL 101   < > 106 93* 99 100 102  CO2 17*   < > 24 21* _1 GLUCOSE 513*   < > 168* 654* 401* 370* 241*  BUN 16   < > _2 CREATININE 0.99   < > 0.62 0.89 0.69 0.79 0.59*  CALCIUM 8.3*   < > 8.3* 8.1* 8.2* 8.7* 8.6*  MG 1.8  --   --   --   --   --   --    < > =  values in this interval not displayed.   Liver Function Tests: Recent Labs  Lab 09/13/19 2332 09/16/19 1909  AST 31 26  ALT 24 30  ALKPHOS 150* 161*  BILITOT 0.6 1.1  PROT 6.0* 6.8  ALBUMIN 3.2* 3.7   Recent Labs  Lab 09/16/19 1909  LIPASE 75*   Recent Labs  Lab 09/13/19 2332  AMMONIA 51*   CBC: Recent Labs  Lab 09/13/19 2332 09/13/19 2332 09/13/19 2349 09/15/19 0433 09/16/19 1909 09/16/19 2112 09/17/19 0311  WBC 6.6  --   --  6.8 9.0  --  7.7  NEUTROABS 2.9  --   --   --  4.8  --   --   HGB 14.0   < > 13.9 15.1 15.6 15.3 14.4  HCT 42.8   < > 41.0 45.2 46.1 45.0 42.3  MCV 95.1  --   --  93.0 93.3  --  90.6  PLT 354  --   --  385 437*  --  381   < > = values in this interval not displayed.   Cardiac Enzymes: No results for input(s): CKTOTAL, CKMB, CKMBINDEX, TROPONINI in the last 168 hours. BNP: Invalid input(s): POCBNP CBG: Recent Labs  Lab 09/19/19 0214 09/19/19 0525 09/19/19 0803 09/19/19 1203 09/19/19 1516  GLUCAP 157* 216* 222* 139* 130*   D-Dimer No results for input(s): DDIMER in the last 72 hours. Hgb A1c Recent Labs    09/17/19 0311 09/17/19 1051  HGBA1C 13.7* 13.7*   Lipid Profile No results for input(s): CHOL, HDL, LDLCALC, TRIG, CHOLHDL, LDLDIRECT in the last 72 hours. Thyroid function studies No results for input(s): TSH, T4TOTAL, T3FREE, THYROIDAB in the last 72 hours.  Invalid input(s): FREET3 Anemia work up No results for input(s):  VITAMINB12, FOLATE, FERRITIN, TIBC, IRON, RETICCTPCT in the last 72 hours. Urinalysis    Component Value Date/Time   COLORURINE COLORLESS (A) 09/16/2019 1949   APPEARANCEUR CLEAR 09/16/2019 1949   LABSPEC 1.026 09/16/2019 1949   PHURINE 5.0 09/16/2019 1949   GLUCOSEU >=500 (A) 09/16/2019 1949   HGBUR NEGATIVE 09/16/2019 1949   BILIRUBINUR NEGATIVE 09/16/2019 1949   KETONESUR 80 (A) 09/16/2019 1949   PROTEINUR NEGATIVE 09/16/2019 1949   NITRITE NEGATIVE 09/16/2019 1949   LEUKOCYTESUR NEGATIVE 09/16/2019 1949   Sepsis Labs Invalid input(s): PROCALCITONIN,  WBC,  LACTICIDVEN Microbiology Recent Results (from the past 240 hour(s))  SARS Coronavirus 2 by RT PCR (hospital order, performed in [place] hospital lab) Nasopharyngeal Nasopharyngeal Swab     Status: None   Collection Time: 09/16/19 10:05 PM   Specimen: Nasopharyngeal Swab  Result Value Ref Range Status   SARS Coronavirus 2 NEGATIVE NEGATIVE Final    Comment: (NOTE) SARS-CoV-2 target nucleic acids are NOT DETECTED.  The SARS-CoV-2 RNA is generally detectable in upper and lower respiratory specimens during the acute phase of infection. The lowest concentration of SARS-CoV-2 viral copies this assay can detect is 250 copies / mL. A negative result does not preclude SARS-CoV-2 infection and should not be used as the sole basis for treatment or other patient management decisions.  A negative result may occur with improper specimen collection / handling, submission of specimen other than nasopharyngeal swab, presence of viral mutation(s) within the areas targeted by this assay, and inadequate number of viral copies (<250 copies / mL). A negative result must be combined with clinical observations, patient history, and epidemiological information.  Fact Sheet for Patients:   StrictlyIdeas.no  Fact Sheet for Healthcare Providers: BankingDealers.co.za  This test is not yet  approved or  cleared by the [place] and has been authorized for detection and/or diagnosis of SARS-CoV-2 by FDA under an Emergency Use Authorization (EUA).  This EUA will remain in effect (meaning this test can be used) for the duration of the COVID-19 declaration under Section 564(b)(1) of the Act, 21 U.S.C. section 360bbb-3(b)(1), unless the authorization is terminated or revoked sooner.  Performed at [facility], [place] [address], [place], [place] [address]      Time coordinating discharge: 36 minutes.  SIGNED:   Hosie Poisson, MD  [facility] 09/19/2019, 4:09 PM

## 2019-09-19 NOTE — Progress Notes (Signed)
Pt/family given discharge instructions, medication lists, follow up appointments, and when to call the doctor.  Pt/family verbalizes understanding. Patient discharged to employee entrance to go to clinic to pick up insulin. Patient was not able to get help as this was the wrong clinic.  Security came and asked about patient. I explained that patient was told if he missed getting his medications from clinic that he would need to go to [facility] as CM had explained to patient earlier.

## 2019-09-19 NOTE — TOC Transition Note (Signed)
Transition of Care [facility]) - CM/SW Discharge Note Donn Pierini RN, BSN Transitions of Care Unit 4E- RN Case Manager [phone]   Patient Details  Name: Justin [name] MRN: [id number] Date of Birth: 05-14-1990  Transition of Care [facility]) CM/SW Contact:  Darrold Span, RN Phone Number: 09/19/2019, 2:41 PM   Clinical Narrative:    Pt stable for transition today, per pt he and his wife are homeless now- have been staying with cousin however cousin has "kicked" them out. Per patient wife has been a patient over at [facility] and is also being discharged today and will be brought over to [facility] once discharged. Pt requested bus passes and info on shelters. Info provided to pt on local shelters, [facility], housing.Bus passes x2 also provided.  Pt states that wife has been approved for food stamps but had card stolen last week so will need to f/u with social services. Pt has [place] Medicaid- per pt he has not applied for [place] Medicaid has been here since April. Mom is still out in [facility].  Pt reports that he has been buying his insulin from [facility] out of pocket.  Per ED paperwork pt has an appointment with [facility] on 7/6 at 230- can use [facility] pharmacy for low cost meds and insulin- call made to clinic to check price for insulin- which would be $10- Pt informed that he can use this pharmacy for insulin needs- pt voiced he knows where pharmacy is. Pt will be provided paper scripts.     Final next level of care: Homeless Shelter Barriers to Discharge: No Barriers Identified   Patient Goals and CMS Choice Patient states their goals for this hospitalization and ongoing recovery are:: need to be able to get help      Discharge Placement               Home/shelter        Discharge Plan and Services   Discharge Planning Services: CM Consult, [facility]                                 Social Determinants of Health (SDOH) Interventions     Readmission Risk  Interventions Readmission Risk Prevention Plan 09/19/2019  Transportation Screening Complete  PCP or Specialist Appt within 3-5 Days Complete  HRI or Home Care Consult Complete  Social Work Consult for Recovery Care Planning/Counseling Complete  Palliative Care Screening Not Applicable  Medication Review Ocean[facility]) Complete

## 2019-09-24 ENCOUNTER — Encounter (HOSPITAL_COMMUNITY): Payer: Self-pay | Admitting: Emergency Medicine

## 2019-09-24 ENCOUNTER — Emergency Department (HOSPITAL_COMMUNITY)
Admission: EM | Admit: 2019-09-24 | Discharge: 2019-09-24 | Disposition: A | Discharge status: Home / Self Care | Payer: PRIVATE HEALTH INSURANCE | Attending: Emergency Medicine | Admitting: Emergency Medicine

## 2019-09-24 ENCOUNTER — Other Ambulatory Visit: Payer: Self-pay

## 2019-09-24 DIAGNOSIS — Y9289 Other specified places as the place of occurrence of the external cause: Secondary | ICD-10-CM | POA: Insufficient documentation

## 2019-09-24 DIAGNOSIS — Y999 Unspecified external cause status: Secondary | ICD-10-CM | POA: Insufficient documentation

## 2019-09-24 DIAGNOSIS — W108XXA Fall (on) (from) other stairs and steps, initial encounter: Secondary | ICD-10-CM | POA: Insufficient documentation

## 2019-09-24 DIAGNOSIS — Y998 Other external cause status: Secondary | ICD-10-CM | POA: Insufficient documentation

## 2019-09-24 DIAGNOSIS — Z87891 Personal history of nicotine dependence: Secondary | ICD-10-CM | POA: Insufficient documentation

## 2019-09-24 DIAGNOSIS — S8002XA Contusion of left knee, initial encounter: Secondary | ICD-10-CM | POA: Insufficient documentation

## 2019-09-24 DIAGNOSIS — Z5321 Procedure and treatment not carried out due to patient leaving prior to being seen by health care provider: Secondary | ICD-10-CM | POA: Insufficient documentation

## 2019-09-24 DIAGNOSIS — Y929 Unspecified place or not applicable: Secondary | ICD-10-CM | POA: Insufficient documentation

## 2019-09-24 DIAGNOSIS — E1110 Type 2 diabetes mellitus with ketoacidosis without coma: Secondary | ICD-10-CM | POA: Insufficient documentation

## 2019-09-24 DIAGNOSIS — E1165 Type 2 diabetes mellitus with hyperglycemia: Secondary | ICD-10-CM | POA: Insufficient documentation

## 2019-09-24 DIAGNOSIS — Y939 Activity, unspecified: Secondary | ICD-10-CM | POA: Insufficient documentation

## 2019-09-24 DIAGNOSIS — Y9384 Activity, sleeping: Secondary | ICD-10-CM | POA: Insufficient documentation

## 2019-09-24 DIAGNOSIS — S81019A Laceration without foreign body, unspecified knee, initial encounter: Secondary | ICD-10-CM | POA: Insufficient documentation

## 2019-09-24 DIAGNOSIS — Z794 Long term (current) use of insulin: Secondary | ICD-10-CM | POA: Insufficient documentation

## 2019-09-24 DIAGNOSIS — W010XXA Fall on same level from slipping, tripping and stumbling without subsequent striking against object, initial encounter: Secondary | ICD-10-CM | POA: Insufficient documentation

## 2019-09-24 LAB — CBG MONITORING, ED: Glucose-Capillary: 496 mg/dL — ABNORMAL HIGH (ref 70–99)

## 2019-09-24 NOTE — ED Notes (Addendum)
Pt wanting to know if we take people back quickly that come by EMS and have lacerations.  Explained delay for treatment room to pt and explained that mode of arrival does not effect pt's acuity.  Attempted to explain triage process to pt.

## 2019-09-24 NOTE — ED Notes (Signed)
No answer for vitals recheck and not seen in lobby since he left after his wound was cleaned.

## 2019-09-24 NOTE — ED Triage Notes (Signed)
Pt BIB [facility], c/o laceration to knee, per EMS pt stated he was sleeping on stairs and rolled off. EMS CBG 423, pt refusing to have blood drawn or another CBG checked here. Hx diabetes, pt reports he's out of insulin.

## 2019-09-24 NOTE — ED Notes (Addendum)
Pt came up and complained that no one cleaned out his wound correctly, after NT cleaned his wound with peroxide. Pt stated he wanted "to be supplied with a hot water compress and a cotton swab to clean wound". Pt was informed that he must wait to be seen in the back for those supplies to be given to him. Pt stormed off.

## 2019-09-24 NOTE — ED Notes (Signed)
This NT cleaned and wrapped pts wound. Pt later ripped off the dressing and walked out of lobby without any explanation of where/what he was doing.

## 2019-09-24 NOTE — ED Triage Notes (Signed)
Patient came by [facility] for fall and he scraped his knee with small laceration. Patient is cussing and being rude.

## 2019-09-25 ENCOUNTER — Emergency Department (HOSPITAL_COMMUNITY)
Admission: EM | Admit: 2019-09-25 | Discharge: 2019-09-25 | Disposition: A | Discharge status: Home / Self Care | Payer: PRIVATE HEALTH INSURANCE | Attending: Emergency Medicine | Admitting: Emergency Medicine

## 2019-09-25 ENCOUNTER — Ambulatory Visit (HOSPITAL_COMMUNITY)
Admission: EM | Admit: 2019-09-25 | Discharge: 2019-09-25 | Disposition: A | Discharge status: Other Acute Inpatient Hospital | Payer: Medicaid - Out of State | Attending: Psychiatry | Admitting: Psychiatry

## 2019-09-25 ENCOUNTER — Emergency Department (HOSPITAL_COMMUNITY): Payer: PRIVATE HEALTH INSURANCE

## 2019-09-25 ENCOUNTER — Encounter (HOSPITAL_COMMUNITY): Payer: Self-pay

## 2019-09-25 DIAGNOSIS — Z91199 Patient's noncompliance with other medical treatment and regimen due to unspecified reason: Secondary | ICD-10-CM

## 2019-09-25 DIAGNOSIS — R739 Hyperglycemia, unspecified: Secondary | ICD-10-CM

## 2019-09-25 DIAGNOSIS — Z9119 Patient's noncompliance with other medical treatment and regimen: Secondary | ICD-10-CM | POA: Insufficient documentation

## 2019-09-25 DIAGNOSIS — E1069 Type 1 diabetes mellitus with other specified complication: Secondary | ICD-10-CM

## 2019-09-25 DIAGNOSIS — F1210 Cannabis abuse, uncomplicated: Secondary | ICD-10-CM | POA: Insufficient documentation

## 2019-09-25 DIAGNOSIS — F1510 Other stimulant abuse, uncomplicated: Secondary | ICD-10-CM

## 2019-09-25 DIAGNOSIS — R451 Restlessness and agitation: Secondary | ICD-10-CM | POA: Insufficient documentation

## 2019-09-25 DIAGNOSIS — R454 Irritability and anger: Secondary | ICD-10-CM | POA: Insufficient documentation

## 2019-09-25 DIAGNOSIS — F1599 Other stimulant use, unspecified with unspecified stimulant-induced disorder: Secondary | ICD-10-CM | POA: Insufficient documentation

## 2019-09-25 DIAGNOSIS — Z79899 Other long term (current) drug therapy: Secondary | ICD-10-CM | POA: Insufficient documentation

## 2019-09-25 DIAGNOSIS — Z5900 Homelessness unspecified: Secondary | ICD-10-CM

## 2019-09-25 DIAGNOSIS — S8002XA Contusion of left knee, initial encounter: Secondary | ICD-10-CM

## 2019-09-25 DIAGNOSIS — E118 Type 2 diabetes mellitus with unspecified complications: Secondary | ICD-10-CM | POA: Insufficient documentation

## 2019-09-25 DIAGNOSIS — F1910 Other psychoactive substance abuse, uncomplicated: Secondary | ICD-10-CM

## 2019-09-25 DIAGNOSIS — R5381 Other malaise: Secondary | ICD-10-CM | POA: Insufficient documentation

## 2019-09-25 DIAGNOSIS — Z20822 Contact with and (suspected) exposure to covid-19: Secondary | ICD-10-CM | POA: Insufficient documentation

## 2019-09-25 DIAGNOSIS — E1065 Type 1 diabetes mellitus with hyperglycemia: Secondary | ICD-10-CM | POA: Insufficient documentation

## 2019-09-25 DIAGNOSIS — R5383 Other fatigue: Secondary | ICD-10-CM | POA: Insufficient documentation

## 2019-09-25 DIAGNOSIS — F1590 Other stimulant use, unspecified, uncomplicated: Secondary | ICD-10-CM

## 2019-09-25 DIAGNOSIS — Z87891 Personal history of nicotine dependence: Secondary | ICD-10-CM | POA: Insufficient documentation

## 2019-09-25 LAB — URINALYSIS, ROUTINE W REFLEX MICROSCOPIC
Bacteria, UA: NONE SEEN
Bacteria, UA: NONE SEEN
Bilirubin Urine: NEGATIVE
Bilirubin Urine: NEGATIVE
Glucose, UA: >=500 mg/dL — AB
Glucose, UA: >=500 mg/dL — AB
Hgb urine dipstick: NEGATIVE
Hgb urine dipstick: NEGATIVE
Ketones, ur: 20 mg/dL — AB
Ketones, ur: 5 mg/dL — AB
Leukocytes,Ua: NEGATIVE
Leukocytes,Ua: NEGATIVE
Nitrite: NEGATIVE
Nitrite: NEGATIVE
Protein, ur: NEGATIVE mg/dL
Protein, ur: NEGATIVE mg/dL
Specific Gravity, Urine: 1.023 (ref 1.005–1.030)
Specific Gravity, Urine: 1.02 (ref 1.005–1.030)
pH: 7 (ref 5.0–8.0)
pH: 7 (ref 5.0–8.0)

## 2019-09-25 LAB — CBC
HCT: 41.5 % (ref 39.0–52.0)
HCT: 38.8 % — ABNORMAL LOW (ref 39.0–52.0)
Hemoglobin: 13.8 g/dL (ref 13.0–17.0)
Hemoglobin: 12.4 g/dL — ABNORMAL LOW (ref 13.0–17.0)
MCH: 31 pg (ref 26.0–34.0)
MCH: 31.2 pg (ref 26.0–34.0)
MCHC: 33.3 g/dL (ref 30.0–36.0)
MCHC: 32 g/dL (ref 30.0–36.0)
MCV: 93.3 fL (ref 80.0–100.0)
MCV: 97.7 fL (ref 80.0–100.0)
Platelets: 429 10*3/uL — ABNORMAL HIGH (ref 150–400)
Platelets: 367 10*3/uL (ref 150–400)
RBC: 4.45 MIL/uL (ref 4.22–5.81)
RBC: 3.97 MIL/uL — ABNORMAL LOW (ref 4.22–5.81)
RDW: 12 % (ref 11.5–15.5)
RDW: 12.2 % (ref 11.5–15.5)
WBC: 9.6 10*3/uL (ref 4.0–10.5)
WBC: 7.3 10*3/uL (ref 4.0–10.5)
nRBC: 0 % (ref 0.0–0.2)
nRBC: 0 % (ref 0.0–0.2)

## 2019-09-25 LAB — BASIC METABOLIC PANEL
Anion gap: 14 (ref 5–15)
Anion gap: 10 (ref 5–15)
BUN: 21 mg/dL — ABNORMAL HIGH (ref 6–20)
BUN: 19 mg/dL (ref 6–20)
CO2: 25 mmol/L (ref 22–32)
CO2: 23 mmol/L (ref 22–32)
Calcium: 8.6 mg/dL — ABNORMAL LOW (ref 8.9–10.3)
Calcium: 8 mg/dL — ABNORMAL LOW (ref 8.9–10.3)
Chloride: 93 mmol/L — ABNORMAL LOW (ref 98–111)
Chloride: 93 mmol/L — ABNORMAL LOW (ref 98–111)
Creatinine, Ser: 1.08 mg/dL (ref 0.61–1.24)
Creatinine, Ser: 0.92 mg/dL (ref 0.61–1.24)
GFR calc Af Amer: >60 mL/min (ref >60)
GFR calc Af Amer: >60 mL/min (ref >60)
GFR calc non Af Amer: >60 mL/min (ref >60)
GFR calc non Af Amer: >60 mL/min (ref >60)
Glucose, Bld: 547 mg/dL (ref 70–99)
Glucose, Bld: 821 mg/dL (ref 70–99)
Potassium: 3.8 mmol/L (ref 3.5–5.1)
Potassium: 4.2 mmol/L (ref 3.5–5.1)
Sodium: 132 mmol/L — ABNORMAL LOW (ref 135–145)
Sodium: 126 mmol/L — ABNORMAL LOW (ref 135–145)

## 2019-09-25 LAB — BETA-HYDROXYBUTYRIC ACID: Beta-Hydroxybutyric Acid: 1.05 mmol/L — ABNORMAL HIGH (ref 0.05–0.27)

## 2019-09-25 LAB — BLOOD GAS, VENOUS
Acid-Base Excess: 2.4 mmol/L — ABNORMAL HIGH (ref 0.0–2.0)
Bicarbonate: 28.1 mmol/L — ABNORMAL HIGH (ref 20.0–28.0)
O2 Saturation: 60.7 %
Patient temperature: 98.6
pCO2, Ven: 50.2 mmHg (ref 44.0–60.0)
pH, Ven: 7.366 (ref 7.250–7.430)
pO2, Ven: 35.3 mmHg (ref 32.0–45.0)

## 2019-09-25 LAB — RAPID URINE DRUG SCREEN, HOSP PERFORMED
Amphetamines: POSITIVE — AB
Amphetamines: NOT DETECTED
Barbiturates: NOT DETECTED
Barbiturates: NOT DETECTED
Benzodiazepines: NOT DETECTED
Benzodiazepines: NOT DETECTED
Cocaine: NOT DETECTED
Cocaine: NOT DETECTED
Opiates: NOT DETECTED
Opiates: NOT DETECTED
Tetrahydrocannabinol: NOT DETECTED
Tetrahydrocannabinol: NOT DETECTED

## 2019-09-25 LAB — URINALYSIS, DIPSTICK ONLY
Bilirubin Urine: NEGATIVE
Glucose, UA: >=500 mg/dL — AB
Hgb urine dipstick: NEGATIVE
Ketones, ur: 5 mg/dL — AB
Leukocytes,Ua: NEGATIVE
Nitrite: NEGATIVE
Protein, ur: NEGATIVE mg/dL
Specific Gravity, Urine: 1.022 (ref 1.005–1.030)
pH: 7 (ref 5.0–8.0)

## 2019-09-25 LAB — CBG MONITORING, ED
Glucose-Capillary: 263 mg/dL — ABNORMAL HIGH (ref 70–99)
Glucose-Capillary: 257 mg/dL — ABNORMAL HIGH (ref 70–99)
Glucose-Capillary: >600 mg/dL (ref 70–99)
Glucose-Capillary: 378 mg/dL — ABNORMAL HIGH (ref 70–99)

## 2019-09-25 LAB — I-STAT VENOUS BLOOD GAS, ED
Acid-Base Excess: 2 mmol/L (ref 0.0–2.0)
Bicarbonate: 27 mmol/L (ref 20.0–28.0)
Calcium, Ion: 1.16 mmol/L (ref 1.15–1.40)
HCT: 37 % — ABNORMAL LOW (ref 39.0–52.0)
Hemoglobin: 12.6 g/dL — ABNORMAL LOW (ref 13.0–17.0)
O2 Saturation: 92 %
Potassium: 3.9 mmol/L (ref 3.5–5.1)
Sodium: 136 mmol/L (ref 135–145)
TCO2: 28 mmol/L (ref 22–32)
pCO2, Ven: 43.8 mmHg — ABNORMAL LOW (ref 44.0–60.0)
pH, Ven: 7.397 (ref 7.250–7.430)
pO2, Ven: 65 mmHg — ABNORMAL HIGH (ref 32.0–45.0)

## 2019-09-25 LAB — MAGNESIUM: Magnesium: 2 mg/dL (ref 1.7–2.4)

## 2019-09-25 LAB — SARS CORONAVIRUS 2 BY RT PCR (HOSPITAL ORDER, PERFORMED IN ~~LOC~~ HOSPITAL LAB): SARS Coronavirus 2: NEGATIVE

## 2019-09-25 LAB — ETHANOL: Alcohol, Ethyl (B): <10 mg/dL (ref <10)

## 2019-09-25 MED ORDER — ASPIRIN 81 MG PO CHEW: 324.0000 mg | CHEWABLE_TABLET | Freq: Once | ORAL | Status: DC

## 2019-09-25 MED ORDER — INSULIN ASPART 100 UNIT/ML ~~LOC~~ SOLN: SUBCUTANEOUS | 11 refills | Status: AC

## 2019-09-25 MED ORDER — TETANUS-DIPHTH-ACELL PERTUSSIS 5-2.5-18.5 LF-MCG/0.5 IM SUSP
0.5000 mL | Freq: Once | INTRAMUSCULAR | Status: DC
  Filled 2019-09-25: qty 0.5

## 2019-09-25 MED ORDER — LACTATED RINGERS IV BOLUS
1000.0000 mL | Freq: Once | INTRAVENOUS | Status: AC
  Administered 2019-09-25: 1000 mL via INTRAVENOUS

## 2019-09-25 MED ORDER — BLOOD GLUCOSE MONITOR KIT: PACK | 1 refills | Status: AC

## 2019-09-25 MED ORDER — INSULIN ASPART 100 UNIT/ML ~~LOC~~ SOLN
8.0000 [IU] | Freq: Once | SUBCUTANEOUS | Status: AC
  Administered 2019-09-25: 8 [IU] via INTRAVENOUS
  Filled 2019-09-25: qty 0.08

## 2019-09-25 MED ORDER — LACTATED RINGERS IV BOLUS
2000.0000 mL | Freq: Once | INTRAVENOUS | Status: AC
  Administered 2019-09-25: 2000 mL via INTRAVENOUS

## 2019-09-25 MED ORDER — SODIUM CHLORIDE 0.9 % IV BOLUS
1000.0000 mL | Freq: Once | INTRAVENOUS | Status: AC
  Administered 2019-09-25: 1000 mL via INTRAVENOUS

## 2019-09-25 MED ORDER — BACITRACIN ZINC 500 UNIT/GM EX OINT
TOPICAL_OINTMENT | Freq: Once | CUTANEOUS | Status: AC
  Administered 2019-09-25: 1 via TOPICAL
  Filled 2019-09-25: qty 1.8

## 2019-09-25 MED ORDER — ONDANSETRON HCL 4 MG/2ML IJ SOLN: 4.0000 mg | Freq: Once | INTRAMUSCULAR | Status: DC

## 2019-09-25 MED ORDER — LORAZEPAM 2 MG/ML IJ SOLN
2.0000 mg | Freq: Once | INTRAMUSCULAR | Status: AC
  Administered 2019-09-25: 2 mg via INTRAVENOUS
  Filled 2019-09-25: qty 1

## 2019-09-25 MED ORDER — INSULIN ASPART 100 UNIT/ML ~~LOC~~ SOLN
10.0000 [IU] | Freq: Once | SUBCUTANEOUS | Status: AC
  Administered 2019-09-25: 10 [IU] via INTRAVENOUS

## 2019-09-25 MED ORDER — HEPARIN (PORCINE) 25000 UT/250ML-% IV SOLN: 1000.0000 [IU]/h | INTRAVENOUS | Status: DC

## 2019-09-25 MED ORDER — "INSULIN SYRINGE-NEEDLE U-100 25G X 1"" 1 ML MISC": 0 refills | Status: AC

## 2019-09-25 MED ORDER — BLOOD GLUCOSE MONITOR KIT: PACK | 1 refills | Status: DC

## 2019-09-25 MED ORDER — NOVOLIN 70/30 (70-30) 100 UNIT/ML ~~LOC~~ SUSP: 25.0000 [IU] | Freq: Two times a day (BID) | SUBCUTANEOUS | 11 refills | Status: AC

## 2019-09-25 NOTE — ED Notes (Signed)
Pt escorted out by security. Pt states that it's "fucking bullshit- you can give me a vial of insulin to leave with". Pt informed that he has been given an rx.

## 2019-09-25 NOTE — ED Notes (Addendum)
Called lab to check on status of urine. They are running it now.

## 2019-09-25 NOTE — ED Notes (Signed)
Lab called with critical glucose 821

## 2019-09-25 NOTE — ED Notes (Signed)
Pt is aware of urine sample but he is unable to provide one at this time.

## 2019-09-25 NOTE — ED Provider Notes (Signed)
Behavioral Health Medical Screening Exam  Justin [name] is a 29 y.o. male.  Patient presented as a walk-in and per the report from the tech the patient appeared to be lethargic and keeps laying his head down on the table.  She stated that the patient reported to her that he was a diabetic and that he was not feeling good.  She notified me and reviewed the chart.  I ordered a point-of-care glucose check and the patient's blood sugar came back > 600.  Upon reviewing the chart it appears that the patient was in the emergency room this morning 09/25/2019 due to contusion on his left knee.  Patient showed positive for amphetamines and also had elevated blood sugar.  It was reported that the patient is homeless and has a history of aggression and agitation.  Last note of patient being discharged from the ED was that he was cursing and yelling and had to be escorted out by security.  Once patient is in the ED and medically cleared we will request for patient to have TTS consult placed for complete evaluation. Patient only reported to me that he was having trouble focusing is why he came to the [facility] today. Patient did not report any suicidal or homicidal ideations and denied any drug use after leaving the ED this morning.   Total Time spent with patient: 30 minutes  Psychiatric Specialty Exam  Presentation  General Appearance:Disheveled  Eye Contact:Poor  Speech:Slow  Speech Volume:Decreased  Handedness:Right   Mood and Affect  Mood:Irritable  Affect:Flat   Thought Process  Thought Processes:Coherent  Descriptions of Associations:Intact  Orientation:Full (Time, Place and Person)  Thought Content:WDL  Hallucinations:None  Ideas of Reference:None  Suicidal Thoughts:No  Homicidal Thoughts:No   Sensorium  Memory:Immediate Fair;Recent Fair;Remote Fair  Judgment:Fair  Insight:Fair   Executive Functions  Concentration:Fair  Attention Span:Fair  Recall:Fair  Fund of  Knowledge:Fair  Language:Fair   Psychomotor Activity  Psychomotor Activity:Decreased   Assets  Assets:Desire for Improvement   Sleep  Sleep:Fair  Number of hours: No data recorded  Physical Exam: Physical Exam Vitals and nursing note reviewed.  Constitutional:      Appearance: He is well-developed.  Cardiovascular:     Rate and Rhythm: Normal rate.  Pulmonary:     Effort: Pulmonary effort is normal.  Musculoskeletal:        General: Normal range of motion.  Skin:    General: Skin is warm.  Neurological:     Mental Status: He is oriented to person, place, and time.    Review of Systems  Constitutional: Positive for malaise/fatigue.   Blood pressure 113/68, pulse 86, temperature (!) 96.6 F (35.9 C), temperature source Skin, resp. rate 16, SpO2 99 %. There is no height or weight on file to calculate BMI.  Musculoskeletal: Strength & Muscle Tone: within normal limits Gait & Station: normal Patient leans: N/A   Recommendations:  Based on my evaluation the patient appears to have an emergency medical condition for which I recommend the patient be transferred to the emergency department for further evaluation.  Maryfrances Bunnell, FNP 09/25/2019, 2:37 PM

## 2019-09-25 NOTE — Discharge Instructions (Signed)
Steps to find a Primary Care Provider (PCP): ° °Call [phone] or [phone] to access "[place] Find a Doctor Service." ° °2.  You may also go on the [place] website at [email or web address] ° °3.  [facility] also frequently accepts new patients. ° °[facility]  °[address] °[place] [address] °[phone] ° °4.  There are also multiple [facility], [facility] and [facility] practices throughout the [place] that are frequently accepting new patients. You may find a clinic that is close to your home and contact them. ° °[facility] °[email or web address] °[phone] ° °[facility] °[email or web address] ° °[facility] °[email or web address] °[phone] ° °[facility] °[email or web address] °[phone] ° °5.  Local Health Departments also can provide primary care services. ° °[facility]  °[address] °[place] [address] [phone] ° °[facility] °[address] °[place] [address] [phone] ° °[facility] °[address]  °[place] [address] °[phone] ° ° °

## 2019-09-25 NOTE — ED Notes (Signed)
Date and time results received: 09/25/19 3:10 AM Test: Glucose Critical Value: 547  Name of Provider Notified: Ward MD

## 2019-09-25 NOTE — ED Triage Notes (Signed)
To triage via EMS from [facility] u/c.  Pt went to u/c for "diabetes and not feeling good" per Money FNP note.  Pt was then sent to ED.  Pt in wheelchair with head leaned over on pillow.  Denies SI/HI. Pt agitated and does not want to answer any questions.  CBG High

## 2019-09-25 NOTE — ED Provider Notes (Signed)
[facility] EMERGENCY DEPARTMENT Provider Note   CSN: [id number] Arrival date & time: 09/25/19  1507     History Chief Complaint  Patient presents with  . Hyperglycemia    Maui Britten is a 29 y.o. male.  HPI     29 year old male history of diabetes, DKA, and diabetes.  Patient has been seen multiple times in the ED and hospitalized over the past month due to hyperglycemia and DKA.  He is homeless and has had substance abuse most recently methamphetamines.  He was seen yesterday in the [facility] ED due to leg pain.  At that time he was also hyperglycemic.  He was evaluated there and eventually was able to be discharged.  He states that he did not have any medication and went to [facility].  He states the police took him to [facility].  He states that he then went to [place] reported that he appeared to be lethargic.  A point-of-care glucose was checked and was greater than 600.  They had patient transported here to [facility].  Past Medical History:  Diagnosis Date  . Diabetes mellitus without complication [facility])     Patient Active Problem List   Diagnosis Date Noted  . Insulin dependent type 2 diabetes mellitus, uncontrolled ([place]) 09/16/2019  . Amphetamine user ([place]) 09/14/2019  . DKA (diabetic ketoacidoses) ([place]) 12/11/2018  . Elevated LFTs 12/11/2018  . Hyponatremia 12/11/2018  . Nausea & vomiting 12/11/2018    Past Surgical History:  Procedure Laterality Date  . right hand surgery.         Family History  Problem Relation Age of Onset  . Diabetes Mellitus II Neg Hx     Social History   Tobacco Use  . Smoking status: Former Research scientist (life sciences)  . Smokeless tobacco: Never Used  Substance Use Topics  . Alcohol use: Yes  . Drug use: Yes    Types: Marijuana    Home Medications Prior to Admission medications   Medication Sig Start Date End Date Taking? Authorizing Provider  blood glucose meter kit and supplies KIT Dispense based on  patient and insurance preference. Use up to four times daily as directed. (FOR ICD-9 250.00, 250.01). For QAC - HS accuchecks. 09/25/19   Ward, Delice Bison, DO  folic acid (FOLVITE) 1 MG tablet Take 1 tablet (1 mg total) by mouth daily. 12/13/18   Thurnell Lose, MD  gabapentin (NEURONTIN) 100 MG capsule Take 2 capsules (200 mg total) by mouth 3 (three) times daily. 09/19/19   Hosie Poisson, MD  insulin aspart (NOVOLOG) 100 UNIT/ML injection CBG 70 - 120: 0 units, CBG 121 - 150: 1 unit, CBG 151 - 200: 2 units, CBG 201 - 250: 3 units, CBG 251 - 300: 5 units, CBG 301 - 350: 7 units, CBG 351 - 400: 9 units 09/25/19   Ward, Delice Bison, DO  insulin NPH-regular Human (NOVOLIN 70/30) (70-30) 100 UNIT/ML injection Inject 25 Units into the skin 2 (two) times daily with a meal. 09/25/19   Ward, Delice Bison, DO  Insulin Syringe-Needle U-100 25G X 1" 1 ML MISC For 4 times a day insulin SQ, 1 month supply. Diagnosis E11.65 09/25/19   Ward, Delice Bison, DO  nicotine (NICODERM CQ - DOSED IN MG/24 HOURS) 14 mg/24hr patch Place 1 patch (14 mg total) onto the skin daily. 09/20/19   Hosie Poisson, MD  pantoprazole (PROTONIX) 40 MG tablet Take 1 tablet (40 mg total) by mouth daily at 6 (six) AM.  09/20/19   Hosie Poisson, MD    Allergies    Patient has no known allergies.  Review of Systems   Review of Systems  All other systems reviewed and are negative.   Physical Exam Updated Vital Signs BP 90/60   Pulse 71   Temp 97.9 F (36.6 C) (Oral)   Resp 16   SpO2 95%   Physical Exam Vitals and nursing note reviewed.  Constitutional:      Appearance: Normal appearance.  HENT:     Head: Normocephalic.     Right Ear: External ear normal.     Left Ear: External ear normal.     Nose: Nose normal.     Mouth/Throat:     Mouth: Mucous membranes are moist.  Eyes:     Extraocular Movements: Extraocular movements intact.     Pupils: Pupils are equal, round, and reactive to light.  Cardiovascular:     Rate and Rhythm: Normal  rate and regular rhythm.     Pulses: Normal pulses.  Pulmonary:     Effort: Pulmonary effort is normal.     Breath sounds: Normal breath sounds.  Abdominal:     General: Abdomen is flat.     Palpations: Abdomen is soft.  Musculoskeletal:        General: Normal range of motion.     Cervical back: Normal range of motion.  Skin:    General: Skin is warm.     Capillary Refill: Capillary refill takes less than 2 seconds.  Neurological:     General: No focal deficit present.     Mental Status: He is alert.  Psychiatric:        Mood and Affect: Mood normal.     ED Results / Procedures / Treatments   Labs (all labs ordered are listed, but only abnormal results are displayed) Labs Reviewed  BASIC METABOLIC PANEL - Abnormal; Notable for the following components:      Result Value   Sodium 126 (*)    Chloride 93 (*)    Glucose, Bld 821 (*)    Calcium 8.0 (*)    All other components within normal limits  CBC - Abnormal; Notable for the following components:   RBC 3.97 (*)    Hemoglobin 12.4 (*)    HCT 38.8 (*)    All other components within normal limits  CBG MONITORING, ED - Abnormal; Notable for the following components:   Glucose-Capillary >600 (*)    All other components within normal limits  URINALYSIS, ROUTINE W REFLEX MICROSCOPIC  CBG MONITORING, ED  I-STAT ARTERIAL BLOOD GAS, ED    EKG None  Radiology DG Knee Complete 4 Views Left  Result Date: 09/25/2019 CLINICAL DATA:  [place], laceration EXAM: LEFT KNEE - COMPLETE 4+ VIEW COMPARISON:  None. FINDINGS: Frontal, bilateral oblique, lateral views of the left knee are obtained. No fracture, subluxation, or dislocation. Joint spaces are well preserved. No effusion. Mild prepatellar soft tissue swelling. IMPRESSION: 1. Prepatellar soft tissue swelling.  No acute fracture. Electronically Signed   By: Randa Ngo M.D.   On: 09/25/2019 02:02    Procedures Procedures (including critical care time)  Medications Ordered in  ED Medications - No data to display  ED Course  I have reviewed the triage vital signs and the nursing notes.  Pertinent labs & imaging results that were available during my care of the patient were reviewed by me and considered in my medical decision making (see chart for details).  MDM Rules/Calculators/A&P                          29 year old male history of polysubstance abuse type 1 diabetes presents today from the behavioral health urgent care.  He was seen earlier this same day at [facility].  At that time he complained of pain in his left knee from the fall.  He was also evaluated for elevated blood sugars.  He reports that he did not have his insulin.  His blood sugar at the behavioral health urgent care was significantly elevated at 800.  Here he was seen and evaluated.  His initial blood sugar greater 821. No evidence of DKA.  Patient's pH is 7.397. He received a liter of normal saline.  He received 10 units of insulin.  Blood sugar is now 378. Social work consult is placed.  Social work consult June 7 April.  He was set up with outpatient resources. Plan discharge  Final Clinical Impression(s) / ED Diagnoses Final diagnoses:  Hyperglycemia  Type 1 diabetes mellitus with other specified complication ([place])  Polysubstance abuse ([place])  Noncompliance  Homeless    Rx / DC Orders ED Discharge Orders    None       Pattricia Boss, MD 09/25/19 2100

## 2019-09-25 NOTE — ED Notes (Signed)
Patient mentioned insulin to [facility]HT on way to room 137 from [place]/search area.Patient was very lethargic during collection of VS. NP ordered CBG report which  read "HI" indicating levels above 600. Nursing is preparing for pt transfer.

## 2019-09-25 NOTE — ED Notes (Signed)
Pt aggressive and shouting obscenities when told he has to leave ED. Escorted out by security.

## 2019-09-25 NOTE — ED Notes (Signed)
Pt encouraged to void, but is still asleep.

## 2019-09-25 NOTE — ED Notes (Signed)
While in triage rm, pt is screaming and using profanity Pt is throwing his personal belongings around the rm Pt informed that his behavior will not be tolerated Security in triage for standby

## 2019-09-25 NOTE — ED Provider Notes (Signed)
TIME SEEN: 1:38 AM  CHIEF COMPLAINT: Hyperglycemia, fall, agitation  HPI: Patient is a 29 year old male with history of insulin-dependent diabetes, homelessness, substance abuse who presents to the emergency department primarily with complaints of left knee pain after he fell yesterday.  Has an abrasion to the knee.  Unable to tell me his last tetanus vaccination.  He states that he slipped and fell onto the knee.  No head injury.  No other traumatic injury.  Has been able to ambulate.  Patient also is hyperglycemic here with recent admission for DKA.  He states he does not feel that he is in DKA today.  No abdominal pain, vomiting.  Reports he is using 70/30 insulin because it is cheap and he has [place] Medicaid.  Patient reports he is having a hard time managing his diabetes due to homelessness.  Patient also reports recent using methamphetamine.  States that he started using more after his wife left him several days ago.  ROS: See HPI Constitutional: no fever  Eyes: no drainage  ENT: no runny nose   Cardiovascular:  no chest pain  Resp: no SOB  GI: no vomiting GU: no dysuria Integumentary: no rash  Allergy: no hives  Musculoskeletal: no leg swelling  Neurological: no slurred speech ROS otherwise negative  PAST MEDICAL HISTORY/PAST SURGICAL HISTORY:  Past Medical History:  Diagnosis Date  . Diabetes mellitus without complication ([place])     MEDICATIONS:  Prior to Admission medications   Medication Sig Start Date End Date Taking? Authorizing Provider  blood glucose meter kit and supplies KIT Dispense based on patient and insurance preference. Use up to four times daily as directed. (FOR ICD-9 250.00, 250.01). For QAC - HS accuchecks. 12/12/18   Thurnell Lose, MD  folic acid (FOLVITE) 1 MG tablet Take 1 tablet (1 mg total) by mouth daily. 12/13/18   Thurnell Lose, MD  gabapentin (NEURONTIN) 100 MG capsule Take 2 capsules (200 mg total) by mouth 3 (three) times daily.  09/19/19   Hosie Poisson, MD  insulin aspart (NOVOLOG) 100 UNIT/ML injection CBG 70 - 120: 0 units CBG 121 - 150: 1 unit CBG 151 - 200: 2 units CBG 201 - 250: 3 units CBG 251 - 300: 5 units CBG 301 - 350: 7 units CBG 351 - 400: 9 units 09/19/19   Hosie Poisson, MD  insulin NPH-regular Human (NOVOLIN 70/30) (70-30) 100 UNIT/ML injection Inject 25 Units into the skin 2 (two) times daily with a meal. 09/19/19   Hosie Poisson, MD  Insulin Syringe-Needle U-100 25G X 1" 1 ML MISC For 4 times a day insulin SQ, 1 month supply. Diagnosis E11.65 09/06/19   [name], MD  nicotine (NICODERM CQ - DOSED IN MG/24 HOURS) 14 mg/24hr patch Place 1 patch (14 mg total) onto the skin daily. 09/20/19   Hosie Poisson, MD  pantoprazole (PROTONIX) 40 MG tablet Take 1 tablet (40 mg total) by mouth daily at 6 (six) AM. 09/20/19   Hosie Poisson, MD    ALLERGIES:  No Known Allergies  SOCIAL HISTORY:  Social History   Tobacco Use  . Smoking status: Former Research scientist (life sciences)  . Smokeless tobacco: Never Used  Substance Use Topics  . Alcohol use: Yes    FAMILY HISTORY: Family History  Problem Relation Age of Onset  . Diabetes Mellitus II Neg Hx     EXAM: BP 122/82 (BP Location: Left Arm)   Pulse 95   Temp 98 F (36.7 C) (Oral)   Resp 20  Ht '5\' 8"'  (1.727 m)   Wt 68 kg   SpO2 99%   BMI 22.81 kg/m  CONSTITUTIONAL: Alert and oriented and responds appropriately to questions.  Chronically ill-appearing, intermittently agitated HEAD: Normocephalic, atraumatic EYES: Conjunctivae clear, pupils appear equal, EOM appear intact ENT: normal nose; moist mucous membranes NECK: Supple, normal ROM CARD: RRR; S1 and S2 appreciated; no murmurs, no clicks, no rubs, no gallops RESP: Normal chest excursion without splinting or tachypnea; breath sounds clear and equal bilaterally; no wheezes, no rhonchi, no rales, no hypoxia or respiratory distress, speaking full sentences ABD/GI: Normal bowel sounds; non-distended; soft,  non-tender, no rebound, no guarding, no peritoneal signs, no hepatosplenomegaly BACK:  The back appears normal EXT: Normal ROM in all joints; patient has prepatellar swelling with associated abrasion to the left knee but no obvious joint effusion or laceration.  There is no redness or warmth.  Wound actually appears like it is healing as if this is old.  2+ DP pulses bilaterally.  Compartments soft.  Otherwise no bony abnormalities noted in the extremities. SKIN: Normal color for age and race; warm; no rash on exposed skin NEURO: Moves all extremities equally, able to ambulate with slightly antalgic gait, normal speech, no facial asymmetry PSYCH: Patient intermittently very agitated, yelling, cursing.  MEDICAL DECISION MAKING: Patient here with concerns for fall with left knee injury.  Will obtain x-ray and provide tetanus vaccination update.  No laceration for repair.  No sign of superimposed infection.  No other sign of traumatic injury.  Patient also hyperglycemic with history of DKA.  Will check labs, urine.  Will give IV fluids as well as IV insulin.  Recently admitted for DKA.  States secondary to homelessness and limited resources he is having a hard time affording insulin.  Will place social work consultation if patient is discharged.  Patient has also been intermittently very agitated here.  Has history of aggressive behavior listed in his chart.  At this time I do not feel he needs emergent psychiatric evaluation.  I suspect that some of this is secondary to recent methamphetamine use.  Will give IV Ativan to help with agitation.  ED PROGRESS: Labs reassuring and do not suggest DKA.  He has had some slight drop in blood pressure but this is with him lying on his side after sedation.  Once he is more aroused and sitting upright, his blood pressure improves.  Blood glucose down to 257.  I have placed a social work consult to help him with PCP, housing and medications.  Refills of his medications  have been sent to his pharmacy.  Will discharge once more awake from sedation.  Patient and nurse updated with plan.  X-ray of the knee shows no acute abnormality other than soft tissue swelling.  Will clean wound, applied dressing and Ace wrap.  He has been ambulatory.  At this time, I do not feel there is any life-threatening condition present. I have reviewed, interpreted and discussed all results (EKG, imaging, lab, urine as appropriate) and exam findings with patient/family. I have reviewed nursing notes and appropriate previous records.  I feel the patient is safe to be discharged home without further emergent workup and can continue workup as an outpatient as needed. Discussed usual and customary return precautions. Patient/family verbalize understanding and are comfortable with this plan.  Outpatient follow-up has been provided as needed. All questions have been answered.    EKG Interpretation  Date/Time:  Sunday September 25 2019 02:34:26 EDT Ventricular Rate:  92 PR Interval:    QRS Duration: 113 QT Interval:  389 QTC Calculation: 482 R Axis:   82 Text Interpretation: Sinus rhythm Borderline intraventricular conduction delay Borderline prolonged QT interval Confirmed by Pryor Curia [id number]) on 09/25/2019 2:42:04 AM         Delray Alt was evaluated in Emergency Department on 09/25/2019 for the symptoms described in the history of present illness. He was evaluated in the context of the global COVID-19 pandemic, which necessitated consideration that the patient might be at risk for infection with the SARS-CoV-2 virus that causes COVID-19. Institutional protocols and algorithms that pertain to the evaluation of patients at risk for COVID-19 are in a state of rapid change based on information released by regulatory bodies including the CDC and federal and state organizations. These policies and algorithms were followed during the patient's care in the ED.      Williemae Muriel, Delice Bison,  DO 09/25/19 [phone]

## 2019-09-25 NOTE — ED Notes (Signed)
Called lab to add on mag  

## 2019-09-25 NOTE — ED Notes (Signed)
Pt informed he would need to start getting ready in 10 minutes.

## 2019-09-25 NOTE — ED Notes (Signed)
Pt asleep in room.

## 2019-09-25 NOTE — ED Notes (Signed)
Pt remains in radiology 

## 2019-09-25 NOTE — ED Notes (Signed)
Pt becoming angry and aggressive when told he was up for discharge. This RN provided a [place] sandwich, explained that he was stable to leave the ED. Provider made aware.

## 2019-09-25 NOTE — Discharge Instructions (Signed)
Please follow-up with your outpatient resources as previously provided.

## 2019-09-26 ENCOUNTER — Encounter: Payer: Self-pay | Admitting: Licensed Clinical Social Worker

## 2019-09-26 MED FILL — NovoLOG 100 UNIT/ML SOLN: 100 | 28 days supply | Qty: 10 | Fill #0

## 2019-09-26 MED FILL — !TRUE METRIX BLOOD GLUCOSE: 30 days supply | Qty: 1 | Fill #0

## 2019-09-26 MED FILL — !LANTUS 100 UNITS/ML VIAL: 100 | 15 days supply | Qty: 10 | Fill #0

## 2019-09-26 MED FILL — TRUE METRIX TEST STRIP: 25 days supply | Qty: 100 | Fill #0

## 2019-09-26 MED FILL — TRUEplus LANCETS 28G MISC: 25 days supply | Qty: 100 | Fill #0

## 2019-09-27 ENCOUNTER — Telehealth: Payer: Self-pay

## 2019-09-27 NOTE — Progress Notes (Signed)
LCSW met with patient, after he was observed in pharmacy line without shoes. Pt reports chronic homelessness and stated that he has not eaten. States that he often "passes out" due to high blood sugars and was recently receiving care in ED. Pt requested assistance obtaining insulin medications and glucose meter.   LCSW observed patient to be highly agitated and fidgety. Pt reports that his spouse is missing and has hx of substance use.   Pt was strongly encouraged to meet with a physician to further assess medical conditions; however, pt refused due to wanting to go to the police department to file a missing person's report on spouse. A HFU appointment was scheduled for 10/04/19 at [facility].   Pt was provided food, bus passes, prescribed medications/meter, and contact information for [employer] to strengthen support system with homelessness. LCSW attempted to provide information regarding substance use resources; however, pt declined. LCSW provided contact information and strongly encouraged patient to reach out with any behavioral health and/or resource needs. No additional concerns noted.

## 2019-09-27 NOTE — Telephone Encounter (Signed)
Late entry - Met with the patient when he was in the clinic yesterday - 09/26/2019.   Please refer to note from Christa See, LCSW for details of the meeting.   The patient refused to be seen by a [facility] provider when he was in the clinic.  He was focused on locating his wife and had concerns about her safety after speaking with her grandmother. He planned to file a missing person report with the police. He just wanted his medications and wanted to leave the clinic.  He received his insulin and glucometer and testing supplies. He said that he knows how to use the meter and how to administer his insulin. He had no other questions at this time Provided him with a copy of his AVS from the ED as he said he could not find it.  The current medications and upcoming appointments were noted on the document.

## 2019-10-04 ENCOUNTER — Telehealth (INDEPENDENT_AMBULATORY_CARE_PROVIDER_SITE_OTHER): Payer: Self-pay | Admitting: Primary Care

## 2019-10-06 ENCOUNTER — Ambulatory Visit: Payer: Self-pay | Admitting: *Deleted

## 2019-10-06 ENCOUNTER — Other Ambulatory Visit: Payer: Self-pay

## 2019-10-06 DIAGNOSIS — E1165 Type 2 diabetes mellitus with hyperglycemia: Secondary | ICD-10-CM

## 2019-10-06 DIAGNOSIS — Z794 Long term (current) use of insulin: Secondary | ICD-10-CM

## 2019-10-06 DIAGNOSIS — IMO0002 Reserved for concepts with insufficient information to code with codable children: Secondary | ICD-10-CM

## 2019-10-06 LAB — GLUCOSE, POCT (MANUAL RESULT ENTRY): POC Glucose: 131 mg/dl — AB (ref 70–99)

## 2019-10-06 NOTE — Progress Notes (Signed)
Patient presented to mobile requesting a refill on DM medications and a CBG check. Patient was seen in ED on 6/27 and received all DM supplies and Insulin refills with a year worth of fills on hold. Patient was scheduled a HFU on 10/04/19 that was changed to virtual per establishing PCP and was unable to be reached due to contact on file. MMU updated the phone number and was able to schedule an OV with [facility] via [facility]. MA spoke with LCSW of [facility] to inform of patients visit and upcoming appointments. Patient received appointment reminder in hand and a copy of medications to verify with pharmacy on pick up tomorrow.

## 2019-10-26 ENCOUNTER — Encounter: Payer: Self-pay | Admitting: *Deleted

## 2019-10-26 ENCOUNTER — Ambulatory Visit (INDEPENDENT_AMBULATORY_CARE_PROVIDER_SITE_OTHER): Payer: Medicaid Other | Admitting: Primary Care
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
## 2019-10-28 ENCOUNTER — Encounter (HOSPITAL_COMMUNITY): Payer: Self-pay | Admitting: Emergency Medicine

## 2019-10-28 ENCOUNTER — Other Ambulatory Visit: Payer: Self-pay

## 2019-10-28 ENCOUNTER — Inpatient Hospital Stay (HOSPITAL_COMMUNITY)
Admission: EM | Admit: 2019-10-28 | Discharge: 2019-10-31 | DRG: 638 | Discharge status: Against Medical Advice | Payer: Medicaid - Out of State | Attending: Family Medicine | Admitting: Family Medicine

## 2019-10-28 DIAGNOSIS — L237 Allergic contact dermatitis due to plants, except food: Secondary | ICD-10-CM

## 2019-10-28 DIAGNOSIS — Z8639 Personal history of other endocrine, nutritional and metabolic disease: Secondary | ICD-10-CM

## 2019-10-28 DIAGNOSIS — F17203 Nicotine dependence unspecified, with withdrawal: Secondary | ICD-10-CM

## 2019-10-28 DIAGNOSIS — Z9114 Patient's other noncompliance with medication regimen: Secondary | ICD-10-CM

## 2019-10-28 DIAGNOSIS — Z20822 Contact with and (suspected) exposure to covid-19: Secondary | ICD-10-CM | POA: Diagnosis present

## 2019-10-28 DIAGNOSIS — F17210 Nicotine dependence, cigarettes, uncomplicated: Secondary | ICD-10-CM | POA: Diagnosis present

## 2019-10-28 DIAGNOSIS — R197 Diarrhea, unspecified: Secondary | ICD-10-CM | POA: Diagnosis present

## 2019-10-28 DIAGNOSIS — E1065 Type 1 diabetes mellitus with hyperglycemia: Secondary | ICD-10-CM

## 2019-10-28 DIAGNOSIS — E1310 Other specified diabetes mellitus with ketoacidosis without coma: Secondary | ICD-10-CM | POA: Diagnosis not present

## 2019-10-28 DIAGNOSIS — F1120 Opioid dependence, uncomplicated: Secondary | ICD-10-CM | POA: Diagnosis not present

## 2019-10-28 DIAGNOSIS — E1142 Type 2 diabetes mellitus with diabetic polyneuropathy: Secondary | ICD-10-CM | POA: Diagnosis present

## 2019-10-28 DIAGNOSIS — Z590 Homelessness: Secondary | ICD-10-CM

## 2019-10-28 DIAGNOSIS — F39 Unspecified mood [affective] disorder: Secondary | ICD-10-CM

## 2019-10-28 DIAGNOSIS — F1920 Other psychoactive substance dependence, uncomplicated: Principal | ICD-10-CM

## 2019-10-28 DIAGNOSIS — Z79899 Other long term (current) drug therapy: Secondary | ICD-10-CM

## 2019-10-28 DIAGNOSIS — F1210 Cannabis abuse, uncomplicated: Secondary | ICD-10-CM

## 2019-10-28 DIAGNOSIS — Z66 Do not resuscitate: Secondary | ICD-10-CM | POA: Diagnosis present

## 2019-10-28 DIAGNOSIS — E1110 Type 2 diabetes mellitus with ketoacidosis without coma: Principal | ICD-10-CM | POA: Diagnosis present

## 2019-10-28 DIAGNOSIS — R45851 Suicidal ideations: Secondary | ICD-10-CM | POA: Diagnosis present

## 2019-10-28 DIAGNOSIS — R3915 Urgency of urination: Secondary | ICD-10-CM | POA: Diagnosis present

## 2019-10-28 DIAGNOSIS — Z794 Long term (current) use of insulin: Secondary | ICD-10-CM

## 2019-10-28 DIAGNOSIS — F12988 Cannabis use, unspecified with other cannabis-induced disorder: Secondary | ICD-10-CM

## 2019-10-28 DIAGNOSIS — F329 Major depressive disorder, single episode, unspecified: Secondary | ICD-10-CM | POA: Diagnosis present

## 2019-10-28 HISTORY — DX: Type 2 diabetes mellitus with ketoacidosis without coma: E11.10

## 2019-10-28 LAB — BASIC METABOLIC PANEL
Anion gap: 13 (ref 5–15)
Anion gap: 11 (ref 5–15)
Anion gap: 13 (ref 5–15)
BUN: 11 mg/dL (ref 6–20)
BUN: 8 mg/dL (ref 6–20)
BUN: 9 mg/dL (ref 6–20)
CO2: 20 mmol/L — ABNORMAL LOW (ref 22–32)
CO2: 27 mmol/L (ref 22–32)
CO2: 23 mmol/L (ref 22–32)
Calcium: 7.8 mg/dL — ABNORMAL LOW (ref 8.9–10.3)
Calcium: 9 mg/dL (ref 8.9–10.3)
Calcium: 8.7 mg/dL — ABNORMAL LOW (ref 8.9–10.3)
Chloride: 92 mmol/L — ABNORMAL LOW (ref 98–111)
Chloride: 105 mmol/L (ref 98–111)
Chloride: 100 mmol/L (ref 98–111)
Creatinine, Ser: 1.16 mg/dL (ref 0.61–1.24)
Creatinine, Ser: 0.71 mg/dL (ref 0.61–1.24)
Creatinine, Ser: 0.83 mg/dL (ref 0.61–1.24)
GFR calc Af Amer: >60 mL/min (ref >60)
GFR calc Af Amer: >60 mL/min (ref >60)
GFR calc Af Amer: >60 mL/min (ref >60)
GFR calc non Af Amer: >60 mL/min (ref >60)
GFR calc non Af Amer: >60 mL/min (ref >60)
GFR calc non Af Amer: >60 mL/min (ref >60)
Glucose, Bld: 998 mg/dL (ref 70–99)
Glucose, Bld: 65 mg/dL — ABNORMAL LOW (ref 70–99)
Glucose, Bld: 157 mg/dL — ABNORMAL HIGH (ref 70–99)
Potassium: 4.2 mmol/L (ref 3.5–5.1)
Potassium: 3.8 mmol/L (ref 3.5–5.1)
Potassium: 3.3 mmol/L — ABNORMAL LOW (ref 3.5–5.1)
Sodium: 125 mmol/L — ABNORMAL LOW (ref 135–145)
Sodium: 143 mmol/L (ref 135–145)
Sodium: 136 mmol/L (ref 135–145)

## 2019-10-28 LAB — BETA-HYDROXYBUTYRIC ACID
Beta-Hydroxybutyric Acid: 3.43 mmol/L — ABNORMAL HIGH (ref 0.05–0.27)
Beta-Hydroxybutyric Acid: 0.42 mmol/L — ABNORMAL HIGH (ref 0.05–0.27)
Beta-Hydroxybutyric Acid: 1.5 mmol/L — ABNORMAL HIGH (ref 0.05–0.27)

## 2019-10-28 LAB — CBC
HCT: 43.3 % (ref 39.0–52.0)
Hemoglobin: 13.7 g/dL (ref 13.0–17.0)
MCH: 32.2 pg (ref 26.0–34.0)
MCHC: 31.6 g/dL (ref 30.0–36.0)
MCV: 101.6 fL — ABNORMAL HIGH (ref 80.0–100.0)
Platelets: 323 10*3/uL (ref 150–400)
RBC: 4.26 MIL/uL (ref 4.22–5.81)
RDW: 12.2 % (ref 11.5–15.5)
WBC: 6.5 10*3/uL (ref 4.0–10.5)
nRBC: 0 % (ref 0.0–0.2)

## 2019-10-28 LAB — URINALYSIS, ROUTINE W REFLEX MICROSCOPIC
Bacteria, UA: NONE SEEN
Bilirubin Urine: NEGATIVE
Glucose, UA: >=500 mg/dL — AB
Hgb urine dipstick: NEGATIVE
Ketones, ur: 20 mg/dL — AB
Leukocytes,Ua: NEGATIVE
Nitrite: NEGATIVE
Protein, ur: NEGATIVE mg/dL
Specific Gravity, Urine: 1.026 (ref 1.005–1.030)
pH: 5 (ref 5.0–8.0)

## 2019-10-28 LAB — RAPID URINE DRUG SCREEN, HOSP PERFORMED
Amphetamines: NOT DETECTED
Barbiturates: NOT DETECTED
Benzodiazepines: NOT DETECTED
Cocaine: NOT DETECTED
Opiates: NOT DETECTED
Tetrahydrocannabinol: POSITIVE — AB

## 2019-10-28 LAB — GLUCOSE, CAPILLARY
Glucose-Capillary: 132 mg/dL — ABNORMAL HIGH (ref 70–99)
Glucose-Capillary: 162 mg/dL — ABNORMAL HIGH (ref 70–99)
Glucose-Capillary: 331 mg/dL — ABNORMAL HIGH (ref 70–99)
Glucose-Capillary: 281 mg/dL — ABNORMAL HIGH (ref 70–99)

## 2019-10-28 LAB — HEPATIC FUNCTION PANEL
ALT: 37 U/L (ref 0–44)
AST: 19 U/L (ref 15–41)
Albumin: 3.3 g/dL — ABNORMAL LOW (ref 3.5–5.0)
Alkaline Phosphatase: 149 U/L — ABNORMAL HIGH (ref 38–126)
Bilirubin, Direct: 0.1 mg/dL (ref 0.0–0.2)
Indirect Bilirubin: 1.1 mg/dL — ABNORMAL HIGH (ref 0.3–0.9)
Total Bilirubin: 1.2 mg/dL (ref 0.3–1.2)
Total Protein: 5.7 g/dL — ABNORMAL LOW (ref 6.5–8.1)

## 2019-10-28 LAB — I-STAT VENOUS BLOOD GAS, ED
Acid-Base Excess: 3 mmol/L — ABNORMAL HIGH (ref 0.0–2.0)
Bicarbonate: 29.8 mmol/L — ABNORMAL HIGH (ref 20.0–28.0)
Calcium, Ion: 1.16 mmol/L (ref 1.15–1.40)
HCT: 46 % (ref 39.0–52.0)
Hemoglobin: 15.6 g/dL (ref 13.0–17.0)
O2 Saturation: 75 %
Potassium: 4 mmol/L (ref 3.5–5.1)
Sodium: 138 mmol/L (ref 135–145)
TCO2: 31 mmol/L (ref 22–32)
pCO2, Ven: 51 mmHg (ref 44.0–60.0)
pH, Ven: 7.375 (ref 7.250–7.430)
pO2, Ven: 41 mmHg (ref 32.0–45.0)

## 2019-10-28 LAB — CBG MONITORING, ED
Glucose-Capillary: >600 mg/dL (ref 70–99)
Glucose-Capillary: 408 mg/dL — ABNORMAL HIGH (ref 70–99)
Glucose-Capillary: 291 mg/dL — ABNORMAL HIGH (ref 70–99)
Glucose-Capillary: 92 mg/dL (ref 70–99)
Glucose-Capillary: 73 mg/dL (ref 70–99)

## 2019-10-28 LAB — PHOSPHORUS: Phosphorus: 2.6 mg/dL (ref 2.5–4.6)

## 2019-10-28 LAB — LIPASE, BLOOD: Lipase: 31 U/L (ref 11–51)

## 2019-10-28 LAB — MAGNESIUM: Magnesium: 2.2 mg/dL (ref 1.7–2.4)

## 2019-10-28 LAB — SARS CORONAVIRUS 2 BY RT PCR (HOSPITAL ORDER, PERFORMED IN ~~LOC~~ HOSPITAL LAB): SARS Coronavirus 2: NEGATIVE

## 2019-10-28 MED ORDER — INSULIN ASPART 100 UNIT/ML ~~LOC~~ SOLN
0.0000 [IU] | Freq: Every day | SUBCUTANEOUS | Status: DC
  Administered 2019-10-28: 3 [IU] via SUBCUTANEOUS

## 2019-10-28 MED ORDER — INSULIN ASPART 100 UNIT/ML ~~LOC~~ SOLN: 0.0000 [IU] | Freq: Three times a day (TID) | SUBCUTANEOUS | Status: DC

## 2019-10-28 MED ORDER — INSULIN GLARGINE 100 UNIT/ML ~~LOC~~ SOLN
10.0000 [IU] | Freq: Once | SUBCUTANEOUS | Status: AC
  Administered 2019-10-28: 10 [IU] via SUBCUTANEOUS
  Filled 2019-10-28: qty 0.1

## 2019-10-28 MED ORDER — ACETAMINOPHEN 325 MG PO TABS
650.0000 mg | ORAL_TABLET | Freq: Once | ORAL | Status: AC
  Administered 2019-10-28: 650 mg via ORAL
  Filled 2019-10-28: qty 2

## 2019-10-28 MED ORDER — TRIAMCINOLONE ACETONIDE 0.1 % EX OINT
TOPICAL_OINTMENT | Freq: Two times a day (BID) | CUTANEOUS | Status: DC
  Filled 2019-10-28: qty 15

## 2019-10-28 MED ORDER — DIPHENHYDRAMINE HCL 25 MG PO CAPS
25.0000 mg | ORAL_CAPSULE | Freq: Once | ORAL | Status: AC
  Administered 2019-10-28: 25 mg via ORAL
  Filled 2019-10-28: qty 1

## 2019-10-28 MED ORDER — DEXTROSE-NACL 5-0.45 % IV SOLN: INTRAVENOUS | Status: DC

## 2019-10-28 MED ORDER — TRIAMCINOLONE ACETONIDE 0.1 % EX OINT
TOPICAL_OINTMENT | Freq: Two times a day (BID) | CUTANEOUS | Status: DC
  Administered 2019-10-29: 1 via TOPICAL
  Filled 2019-10-28: qty 15

## 2019-10-28 MED ORDER — SODIUM CHLORIDE 0.9 % IV BOLUS
1000.0000 mL | Freq: Once | INTRAVENOUS | Status: AC
  Administered 2019-10-28: 1000 mL via INTRAVENOUS

## 2019-10-28 MED ORDER — ACETAMINOPHEN 325 MG PO TABS
650.0000 mg | ORAL_TABLET | Freq: Four times a day (QID) | ORAL | Status: DC | PRN
  Administered 2019-10-29: 650 mg via ORAL
  Filled 2019-10-28 (×2): qty 2

## 2019-10-28 MED ORDER — SODIUM CHLORIDE 0.9 % IV SOLN: INTRAVENOUS | Status: DC

## 2019-10-28 MED ORDER — POTASSIUM CHLORIDE 10 MEQ/100ML IV SOLN: 10.0000 meq | INTRAVENOUS | Status: DC | PRN

## 2019-10-28 MED ORDER — INSULIN ASPART 100 UNIT/ML ~~LOC~~ SOLN
10.0000 [IU] | Freq: Once | SUBCUTANEOUS | Status: AC
  Administered 2019-10-28: 10 [IU] via SUBCUTANEOUS

## 2019-10-28 MED ORDER — DEXTROSE 50 % IV SOLN
0.0000 mL | INTRAVENOUS | Status: DC | PRN
  Filled 2019-10-28: qty 50

## 2019-10-28 MED ORDER — INSULIN REGULAR(HUMAN) IN NACL 100-0.9 UT/100ML-% IV SOLN
INTRAVENOUS | Status: DC
  Administered 2019-10-28: 6.5 [IU]/h via INTRAVENOUS
  Administered 2019-10-28: 14 [IU]/h via INTRAVENOUS
  Filled 2019-10-28 (×2): qty 100

## 2019-10-28 MED ORDER — ENOXAPARIN SODIUM 40 MG/0.4ML ~~LOC~~ SOLN
40.0000 mg | SUBCUTANEOUS | Status: DC
  Administered 2019-10-28 – 2019-10-29 (×2): 40 mg via SUBCUTANEOUS
  Filled 2019-10-28 (×2): qty 0.4

## 2019-10-28 MED ORDER — ACETAMINOPHEN 650 MG RE SUPP: 650.0000 mg | Freq: Four times a day (QID) | RECTAL | Status: DC | PRN

## 2019-10-28 NOTE — Progress Notes (Signed)
Patient arrived from ED at 1730hrs.  Oriented to unit and plan of care for shift.  Insulin gtt IV restarted per Endo tool for BG of 132. Patient became agitated during admission process and removed ECG monitor and pulse ox.  Stated he was itching and did not want to answer so many personal questions.  Family medicine team notified and order for benadryl written.  Patient agreed to place monitor back on after Kenalog cream applied.  Lantus insulin given at 1830hrs and insulin gtt to be discontinued in 2 hours.
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
## 2019-10-28 NOTE — ED Triage Notes (Addendum)
Arrived via EMS patient states out of insulin and syringes. States recently homeless. Today states "feels drunk" fatigue, general weakness, thirsty, and urinating a lot. Alert answering and following commands appropriate. EMS administered 0.9 NS 500 ml prior to arrival.

## 2019-10-28 NOTE — ED Provider Notes (Signed)
[facility] EMERGENCY DEPARTMENT Provider Note   CSN: [id number] Arrival date & time: 10/28/19  9935    History Chief Complaint  Patient presents with  . Hyperglycemia   Justin [name] is a 29 y.o. male with past medical history significant for DM insulin dependent, homelessness, Polysubatance abuse, hyponatremia who presents for evaluation of elevated blood sugars.  Patient states he has had difficulty since he has been homeless over the last month with elevated blood sugars.  Admits to polyuria and polydipsia. Admits recent methamphetamine use.  States he has had some itching to his bilateral lower extremities which he states is from poison ivy.  No active vesicles but he does have some excoriations.  No active fluctuance or induration.  Denies fever, chills, chest pain, shortness of breath, dumping, diarrhea, dysuria.  Admits to nausea without emesis.  Denies additional aggravating or relieving factors. Denies pain.  History obtained from patient and past medical records. No interpretor was used.   HPI     Past Medical History:  Diagnosis Date  . Diabetes mellitus without complication [facility])     Patient Active Problem List   Diagnosis Date Noted  . Insulin dependent type 2 diabetes mellitus, uncontrolled ([place]) 09/16/2019  . Amphetamine user ([place]) 09/14/2019  . DKA (diabetic ketoacidoses) ([place]) 12/11/2018  . Elevated LFTs 12/11/2018  . Hyponatremia 12/11/2018  . Nausea & vomiting 12/11/2018    Past Surgical History:  Procedure Laterality Date  . right hand surgery.         Family History  Problem Relation Age of Onset  . Diabetes Mellitus II Neg Hx     Social History   Tobacco Use  . Smoking status: Former Research scientist (life sciences)  . Smokeless tobacco: Never Used  Substance Use Topics  . Alcohol use: Yes  . Drug use: Yes    Types: Marijuana    Home Medications Prior to Admission medications   Medication Sig Start Date End Date Taking? Authorizing Provider    blood glucose meter kit and supplies KIT Dispense based on patient and insurance preference. Use up to four times daily as directed. (FOR ICD-9 250.00, 250.01). For QAC - HS accuchecks. Patient taking differently: Inject 1 each into the skin See admin instructions. Dispense based on patient and insurance preference. Use up to four times daily as directed. (FOR ICD-9 250.00, 250.01). For QAC - HS accuchecks. 09/25/19  Yes Ward, Kristen N, DO  insulin aspart (NOVOLOG) 100 UNIT/ML injection CBG 70 - 120: 0 units, CBG 121 - 150: 1 unit, CBG 151 - 200: 2 units, CBG 201 - 250: 3 units, CBG 251 - 300: 5 units, CBG 301 - 350: 7 units, CBG 351 - 400: 9 units Patient taking differently: Inject 1-9 Units into the skin See admin instructions. CBG 70 - 120: 0 units, CBG 121 - 150: 1 unit, CBG 151 - 200: 2 units, CBG 201 - 250: 3 units, CBG 251 - 300: 5 units, CBG 301 - 350: 7 units, CBG 351 - 400: 9 units 09/25/19  Yes Ward, Kristen N, DO  Insulin Syringe-Needle U-100 25G X 1" 1 ML MISC For 4 times a day insulin SQ, 1 month supply. Diagnosis E11.65 Patient taking differently: 1 each by Other route See admin instructions. For 4 times a day insulin SQ, 1 month supply. Diagnosis E11.65 09/25/19  Yes Ward, Kristen N, DO  LANTUS 100 UNIT/ML injection Inject 20 Units into the skin in the morning and at bedtime. 09/26/19  Yes [provider]  folic acid (FOLVITE) 1 MG tablet Take 1 tablet (1 mg total) by mouth daily. Patient not taking: Reported on 10/28/2019 12/13/18   Thurnell Lose, MD  gabapentin (NEURONTIN) 100 MG capsule Take 2 capsules (200 mg total) by mouth 3 (three) times daily. Patient not taking: Reported on 10/28/2019 09/19/19   Hosie Poisson, MD  insulin NPH-regular Human (NOVOLIN 70/30) (70-30) 100 UNIT/ML injection Inject 25 Units into the skin 2 (two) times daily with a meal. Patient not taking: Reported on 10/28/2019 09/25/19   Ward, Delice Bison, DO  nicotine (NICODERM CQ - DOSED IN MG/24 HOURS) 14  mg/24hr patch Place 1 patch (14 mg total) onto the skin daily. Patient not taking: Reported on 10/28/2019 09/20/19   Hosie Poisson, MD  pantoprazole (PROTONIX) 40 MG tablet Take 1 tablet (40 mg total) by mouth daily at 6 (six) AM. Patient not taking: Reported on 10/28/2019 09/20/19   Hosie Poisson, MD    Allergies    Patient has no known allergies.  Review of Systems   Review of Systems  Constitutional: Negative.   HENT: Negative.   Respiratory: Negative.   Cardiovascular: Negative.   Gastrointestinal: Positive for nausea. Negative for abdominal distention, abdominal pain, anal bleeding, blood in stool, diarrhea, rectal pain and vomiting.  Endocrine: Positive for polydipsia and polyuria.  Genitourinary: Negative.   Musculoskeletal: Negative.   Skin: Negative.   Neurological: Positive for weakness (Generalized). Negative for dizziness, tremors, seizures, syncope, facial asymmetry, speech difficulty, light-headedness, numbness and headaches.  All other systems reviewed and are negative.  Physical Exam Updated Vital Signs BP 110/82   Pulse 69   Temp 97.9 F (36.6 C) (Oral)   Resp 15   SpO2 99%   Physical Exam Vitals and nursing note reviewed.  Constitutional:      General: He is not in acute distress.    Appearance: He is not ill-appearing, toxic-appearing or diaphoretic.  HENT:     Head: Normocephalic and atraumatic.     Jaw: There is normal jaw occlusion.     Right Ear: Tympanic membrane, ear canal and external ear normal. There is no impacted cerumen. No hemotympanum. Tympanic membrane is not injected, scarred, perforated, erythematous, retracted or bulging.     Left Ear: Tympanic membrane, ear canal and external ear normal. There is no impacted cerumen. No hemotympanum. Tympanic membrane is not injected, scarred, perforated, erythematous, retracted or bulging.     Ears:     Comments: No Mastoid tenderness.    Nose:     Comments: Clear rhinorrhea and congestion to bilateral  nares.  No sinus tenderness.    Mouth/Throat:     Comments: Posterior oropharynx clear.  Mucous membranes moist.  Tonsils without erythema or exudate.  Uvula midline without deviation.  No evidence of PTA or RPA.  No drooling, dysphasia or trismus.  Phonation normal. Neck:     Trachea: Trachea and phonation normal.     Meningeal: Brudzinski's sign and Kernig's sign absent.     Comments: No Neck stiffness or neck rigidity.  No meningismus.  No cervical lymphadenopathy. Cardiovascular:     Comments: No murmurs rubs or gallops. Pulmonary:     Comments: Clear to auscultation bilaterally without wheeze, rhonchi or rales.  No accessory muscle usage.  Able speak in full sentences. Abdominal:     Comments: Soft, nontender without rebound or guarding.  No CVA tenderness.  Musculoskeletal:     Comments: Moves all 4 extremities without difficulty.  Lower extremities without edema, erythema or  warmth. Able to flex and extend without difficulty  Skin:    Comments: Brisk capillary refill. Exoriations  throughout body however no fluctuance, induration.  Erythematous rash consistent with contact dermatitis to left ankle  Neurological:     Mental Status: He is alert.     Comments: Ambulatory in department without difficulty.  Cranial nerves II through XII grossly intact.  No facial droop.  No aphasia.    ED Results / Procedures / Treatments   Labs (all labs ordered are listed, but only abnormal results are displayed) Labs Reviewed  BASIC METABOLIC PANEL - Abnormal; Notable for the following components:      Result Value   Sodium 125 (*)    Chloride 92 (*)    CO2 20 (*)    Glucose, Bld 998 (*)    Calcium 7.8 (*)    All other components within normal limits  CBC - Abnormal; Notable for the following components:   MCV 101.6 (*)    All other components within normal limits  URINALYSIS, ROUTINE W REFLEX MICROSCOPIC - Abnormal; Notable for the following components:   Color, Urine COLORLESS (*)     Glucose, UA >=500 (*)    Ketones, ur 20 (*)    All other components within normal limits  BETA-HYDROXYBUTYRIC ACID - Abnormal; Notable for the following components:   Beta-Hydroxybutyric Acid 3.43 (*)    All other components within normal limits  RAPID URINE DRUG SCREEN, HOSP PERFORMED - Abnormal; Notable for the following components:   Tetrahydrocannabinol POSITIVE (*)    All other components within normal limits  HEPATIC FUNCTION PANEL - Abnormal; Notable for the following components:   Total Protein 5.7 (*)    Albumin 3.3 (*)    Alkaline Phosphatase 149 (*)    Indirect Bilirubin 1.1 (*)    All other components within normal limits  CBG MONITORING, ED - Abnormal; Notable for the following components:   Glucose-Capillary >600 (*)    All other components within normal limits  CBG MONITORING, ED - Abnormal; Notable for the following components:   Glucose-Capillary 408 (*)    All other components within normal limits  I-STAT VENOUS BLOOD GAS, ED - Abnormal; Notable for the following components:   Bicarbonate 29.8 (*)    Acid-Base Excess 3.0 (*)    All other components within normal limits  CBG MONITORING, ED - Abnormal; Notable for the following components:   Glucose-Capillary 291 (*)    All other components within normal limits  SARS CORONAVIRUS 2 BY RT PCR (HOSPITAL ORDER, [facility])  LIPASE, BLOOD  BASIC METABOLIC PANEL  BASIC METABOLIC PANEL  BASIC METABOLIC PANEL  BASIC METABOLIC PANEL  BETA-HYDROXYBUTYRIC ACID  BETA-HYDROXYBUTYRIC ACID  BETA-HYDROXYBUTYRIC ACID  BETA-HYDROXYBUTYRIC ACID  MAGNESIUM  MAGNESIUM  PHOSPHORUS  PHOSPHORUS    EKG None  Radiology No results found.  Procedures .Critical Care Performed by: Nettie Elm, PA-C Authorized by: Nettie Elm, PA-C   Critical care provider statement:    Critical care time (minutes):  45   Critical care was necessary to treat or prevent imminent or life-threatening  deterioration of the following conditions:  Endocrine crisis   Critical care was time spent personally by me on the following activities:  Discussions with consultants, evaluation of patient's response to treatment, examination of patient, ordering and performing treatments and interventions, ordering and review of laboratory studies, ordering and review of radiographic studies, pulse oximetry, re-evaluation of patient's condition, obtaining history from patient or  surrogate and review of old charts   (including critical care time)  Medications Ordered in ED Medications  insulin regular, human (MYXREDLIN) 100 units/ 100 mL infusion (9 Units/hr Intravenous Rate/Dose Change 10/28/19 1350)  0.9 %  sodium chloride infusion ( Intravenous New Bag/Given 10/28/19 1305)  dextrose 5 %-0.45 % sodium chloride infusion (has no administration in time range)  dextrose 50 % solution 0-50 mL (has no administration in time range)  enoxaparin (LOVENOX) injection 40 mg (has no administration in time range)  acetaminophen (TYLENOL) tablet 650 mg (has no administration in time range)    Or  acetaminophen (TYLENOL) suppository 650 mg (has no administration in time range)  sodium chloride 0.9 % bolus 1,000 mL (0 mLs Intravenous Stopped 10/28/19 1119)  acetaminophen (TYLENOL) tablet 650 mg (650 mg Oral Given 10/28/19 1104)  insulin aspart (novoLOG) injection 10 Units (10 Units Subcutaneous Given 10/28/19 1104)   ED Course  I have reviewed the triage vital signs and the nursing notes.  Pertinent labs & imaging results that were available during my care of the patient were reviewed by me and considered in my medical decision making (see chart for details).  29 year old presents for evaluation of hyperglycemia.  Known diabetic with polyuria and polydipsia.  Unfortunately patient homeless and difficulty with medications, specifically his insulin.  States he last used insulin yesterday.  Does admit to methamphetamine use.   Does have erythematous rash with some excoriations however seems consistent with contact dermatitis.  No fluctuance, induration.  He has full range of motion to joints have low suspicion for septic joint, bacterial infectious process.  His compartments are soft.  Abdomen soft.  Heart and lungs clear.  He is without tachycardia, tachypnea or hypoxia.  No new heart murmur.  CBG later than 600.  Started on IV fluids, additional labs and imaging ordered.  Labs and imaging personally reviewed and interpreted: CBC BMP hyponatremia however corrected to 139 in setting of hyperglycemia Hepatic Functional panel elevated Alk Phos art 149 however abdomen soft, nontender. Negative Murphy sign. Low suspicion for obstructing stone, bacterial infectious process. Lipase 31 UDS positive for THC UA negative for infection however does have glucosuria and ketonuria VBG pending, clotted. Will need redraw Beta hydroxy elevated at 3.43  Patient reassessed. Discussed blood sugar at 998 with elevated beta hydroxy. I feel patient is likely early DKA. He was given initial bolus of insulin as well as IV fluids. Will start on Endo tool and close monitoring of his labs. Plan on admission.  Patient's rash consistent with contact dermatitis. I have low suspicion for gangrene, bacterial infectious process, gas forming organism or abscess or cellulitis, septic joint.  CONSULT with Family Med teaching service  who agrees to evaluate patient for admission.  Patient with IVF, Insulin drip for likly earlier DKA given elevated Beta hydroxy, ketonuria and CBG at 998  The patient appears reasonably stabilized for admission considering the current resources, flow, and capabilities available in the ED at this time, and I doubt any other [facility] requiring further screening and/or treatment in the ED prior to admission.    MDM Rules/Calculators/A&P                           Final Clinical Impression(s) / ED Diagnoses Final diagnoses:    Polysubstance (including opioids) dependence, daily use ([place])  Diabetic ketoacidosis without coma associated with other specified diabetes mellitus ([place])    Rx / DC Orders ED Discharge  Orders    None       Renella Steig A, PA-C 10/28/19 1456    Lucrezia Starch, MD 10/29/19 1014

## 2019-10-28 NOTE — H&P (Addendum)
[facility] Admission History and Physical Service Pager: [phone]  Patient name: Justin [name] Medical record number: [id number] Date of birth: 04/30/1990 Age: 29 y.o. Gender: male  Primary Care Provider: Patient, No Pcp Per Consultants: None Code Status: DNR Preferred Emergency Contact: None  Chief Complaint: "Blood sugar is high"  Assessment and Plan: Justin [name] is a 29 y.o. male presenting with hyperglycemia secondary to IDDM. PMH is significant for IDDM, homelessness, polysubstance abuse, hyponatremia.  Mild DKA/ HHS 2/2 Poorly controlled IDDM Patient has a history of IDDM. Last hospitalized 09/18/2019 and 12/19/2018 with DKA. Suspect DKA/HHS due to poorly IDDM versus medication noncompliance patient. Patient does not report vomiting but does report 2 days of diarrhea. Patient has a history of homelessness and difficulty obtaining insulin, however he feels his diabetes is fairly well controlled.  [facility], but reports he ran out 10/26/2019. Initial CBG >600. Glucose on subsequent BMP >998. Most recent CBGs 408>291. BHB 3.43. UA w/ 20 ketones and elevated glucose. AG 13. Na 125 w/ hyperglycemia corrected to 139. K 4.2. Albumin 3.3, ALP 149. VBG= pHV 7.3, pCO2 51, pO2 41, HCO3 29.8.  Placed on Endo tool. Will admit for observation as glucose stabilizes. Will need to transition off of insulin gtt to subQ insulin when appropriate. Will need to get CSW on board to facilitate resources, medication assistance and/or PCP needs.  -Admit to progressive, Dr. Nori Riis on at [place] NS at 75 mL/h and transition to D5 half-normal saline per Endotool -F/U BMP every 4 hours - consult SW for f/u with [facility] - Consult pharm about insulin for outpatient -f/u CBG q 2hrs - Continue insulin gtt -NPO - Start 49mq /hr; replete K PRN while monitoring BMPs - consult diabetes coordinator team - Consult to CSW:  PCP/medication/resources  C/f Poison Ivy Patient has a history of homelessness.  Patient reports sleeping in the woods.  He has a new rash covering  lower extremities, lower abdomen, genitalia, and back, bilateral arms.  Rash is red and pruritic.  Rash appears maculopapular. concerned for poison ivy vs bites from insect nest vs syphilis [facility] fever. Decreased suspicion for [facility] due to no fever, headaches, nor myalgias.  However it is endemic to the area and patient does report recent diarrhea and the rash fits RMSF pattern.   - triamcinolone cream -May consider doxy 100 twice daily - f/u patient about timetable for rash spread  Substance abuse UDS + for THC on admission.  Patient has a history of polysubstance abuse.  UDS at last admission positive for amphetamines. - Monitor patient for signs of withdrawal - COWS  Homelessness Patient has a history of homelessness and reports that he presented to the ED from the woods. He was recently in a home without power or running water with his fiance that left in a care with her grandmother. He has not been back to that house and has been sleeping in the woods. He has trouble securing insulin regularly and reports that he got this past month insulin after discharge in June from the wellness.  Patient's hospitalizations appear to be avoidable if the patient can secure a regular insulin. -Consult social work as above  FEN/GI: NPO Prophylaxis: Lovenox  Disposition: Progressive  History of Present Illness:  Justin Kazlauskasis a 29y.o. male presenting with hyperglycemia secondary to IDDM.  Patient presented to ED after he noticed his glucose was extremely  high. he has been living on the street for the past week and reports that prior to that he was living in a home with his "wife." "  Wife" is currently out of the picture. The home had no running water or electricity.  Patient reports that he eats about one meal per day.   He reports having decent control over his insulin regimen. Patient reports that he ran out of insulin yesterday.  He has had diarrhea (no blood in stool) for the past 4 days and shortness of breath for 2 days.   Smoked a blunt last night.  Patient reports that he only knowingly ingested marijuana over this past week.  However he believes he may have been "slipped something in "wine."  Of note patient is abrupt with his answers and appears irritated.  He would like to be DNR and says "leave me dead. "   ED course Patient received NS bolus and 10 unit regular insulin IV bolus.  Patient started on insulin infusion and mIVF.  Review Of Systems: Per HPI with the following additions:   Review of Systems  Eyes: Positive for visual disturbance.  Respiratory: Positive for shortness of breath.   Cardiovascular: Negative for chest pain.  Gastrointestinal: Positive for diarrhea.  Genitourinary: Positive for frequency and urgency.  Neurological: Positive for dizziness and light-headedness.     Patient Active Problem List   Diagnosis Date Noted  . Insulin dependent type 2 diabetes mellitus, uncontrolled ([place]) 09/16/2019  . Amphetamine user ([place]) 09/14/2019  . DKA (diabetic ketoacidoses) ([place]) 12/11/2018  . Elevated LFTs 12/11/2018  . Hyponatremia 12/11/2018  . Nausea & vomiting 12/11/2018   Past Medical History: Past Medical History:  Diagnosis Date  . Diabetes mellitus without complication [facility])    Past Surgical History: Past Surgical History:  Procedure Laterality Date  . right hand surgery.     Social History: Social History   Tobacco Use  . Smoking status: Former Research scientist (life sciences)  . Smokeless tobacco: Never Used  Substance Use Topics  . Alcohol use: Yes  . Drug use: Yes    Types: Marijuana   Additional social history: Homelessness Please also refer to relevant sections of EMR.  Family History: Family History  Problem Relation Age of Onset  . Diabetes Mellitus II Neg Hx       Allergies and Medications: No Known Allergies No current facility-administered medications on file prior to encounter.   Current Outpatient Medications on File Prior to Encounter  Medication Sig Dispense Refill  . blood glucose meter kit and supplies KIT Dispense based on patient and insurance preference. Use up to four times daily as directed. (FOR ICD-9 250.00, 250.01). For QAC - HS accuchecks. (Patient taking differently: Inject 1 each into the skin See admin instructions. Dispense based on patient and insurance preference. Use up to four times daily as directed. (FOR ICD-9 250.00, 250.01). For QAC - HS accuchecks.) 1 each 1  . insulin aspart (NOVOLOG) 100 UNIT/ML injection CBG 70 - 120: 0 units, CBG 121 - 150: 1 unit, CBG 151 - 200: 2 units, CBG 201 - 250: 3 units, CBG 251 - 300: 5 units, CBG 301 - 350: 7 units, CBG 351 - 400: 9 units (Patient taking differently: Inject 1-9 Units into the skin See admin instructions. CBG 70 - 120: 0 units, CBG 121 - 150: 1 unit, CBG 151 - 200: 2 units, CBG 201 - 250: 3 units, CBG 251 - 300: 5 units, CBG 301 - 350: 7 units, CBG  351 - 400: 9 units) 10 mL 11  . Insulin Syringe-Needle U-100 25G X 1" 1 ML MISC For 4 times a day insulin SQ, 1 month supply. Diagnosis E11.65 (Patient taking differently: 1 each by Other route See admin instructions. For 4 times a day insulin SQ, 1 month supply. Diagnosis E11.65) 30 each 0  . LANTUS 100 UNIT/ML injection Inject 20 Units into the skin in the morning and at bedtime.    . folic acid (FOLVITE) 1 MG tablet Take 1 tablet (1 mg total) by mouth daily. (Patient not taking: Reported on 10/28/2019) 30 tablet 0  . gabapentin (NEURONTIN) 100 MG capsule Take 2 capsules (200 mg total) by mouth 3 (three) times daily. (Patient not taking: Reported on 10/28/2019) 90 capsule 0  . insulin NPH-regular Human (NOVOLIN 70/30) (70-30) 100 UNIT/ML injection Inject 25 Units into the skin 2 (two) times daily with a meal. (Patient not taking:  Reported on 10/28/2019) 10 mL 11  . nicotine (NICODERM CQ - DOSED IN MG/24 HOURS) 14 mg/24hr patch Place 1 patch (14 mg total) onto the skin daily. (Patient not taking: Reported on 10/28/2019) 28 patch 0  . pantoprazole (PROTONIX) 40 MG tablet Take 1 tablet (40 mg total) by mouth daily at 6 (six) AM. (Patient not taking: Reported on 10/28/2019) 30 tablet 0    Objective: BP 105/75   Pulse 74   Temp 97.9 F (36.6 C) (Oral)   Resp 16   SpO2 100%  Exam: General: Patient in bed uncomfortable, NAD, irritated Eyes: Pupils 3 mm bilaterally, erythematous sclera, sensitive to light, normal EOM ENTM: Moist mucous membranes Neck: No cervical lymphadenopathy palpated Cardiovascular: Regular heart rate and rhythm, no murmur detected Respiratory: Clear and equal bilaterally, no extra work of breathing noted Gastrointestinal: Active bowel sounds, pain to the left upper quadrant and left lower quadrant with palpation MSK: Patient able to move all limbs on his own and push himself up and rotate in bed Derm: Erythematous, pruritic rash with multiple small vesicles across the lower extremities lower arms lower abdomen and back and per patient genitalia.  Picture is in media. Psych: Patient is obviously agitated visibly seen flipping around in bed and fidgeting with his legs  Labs and Imaging: CBC BMET  Recent Labs  Lab 10/28/19 1031  WBC 6.5  HGB 13.7  HCT 43.3  PLT 323   Recent Labs  Lab 10/28/19 1031  NA 125*  K 4.2  CL 92*  CO2 20*  BUN 11  CREATININE 1.16  GLUCOSE 998*  CALCIUM 7.8*     EKG:  10/28/2019- NSR  Written by: Damita Dunnings, MD 10/28/19 PGY-1 [place] Intern pager: [phone], text pages welcome  [place] Upper-Level Resident Addendum   I have independently interviewed and examined the patient. I have discussed the above with the original author and agree with their documentation. My edits for correction/addition/clarification are in [place]. Please see  also any attending notes.   Gerlene Fee, DO PGY-2, [place] [facility] 10/28/2019 5:17 PM  FPTS Service pager: [phone] (text pages welcome through [facility])

## 2019-10-29 DIAGNOSIS — F1210 Cannabis abuse, uncomplicated: Secondary | ICD-10-CM | POA: Diagnosis present

## 2019-10-29 DIAGNOSIS — Z79899 Other long term (current) drug therapy: Secondary | ICD-10-CM | POA: Diagnosis not present

## 2019-10-29 DIAGNOSIS — Z20822 Contact with and (suspected) exposure to covid-19: Secondary | ICD-10-CM | POA: Diagnosis present

## 2019-10-29 DIAGNOSIS — Z794 Long term (current) use of insulin: Secondary | ICD-10-CM | POA: Diagnosis not present

## 2019-10-29 DIAGNOSIS — E1310 Other specified diabetes mellitus with ketoacidosis without coma: Secondary | ICD-10-CM | POA: Diagnosis not present

## 2019-10-29 DIAGNOSIS — F17203 Nicotine dependence unspecified, with withdrawal: Secondary | ICD-10-CM | POA: Diagnosis not present

## 2019-10-29 DIAGNOSIS — F17210 Nicotine dependence, cigarettes, uncomplicated: Secondary | ICD-10-CM | POA: Diagnosis present

## 2019-10-29 DIAGNOSIS — R45851 Suicidal ideations: Secondary | ICD-10-CM | POA: Diagnosis present

## 2019-10-29 DIAGNOSIS — Z8639 Personal history of other endocrine, nutritional and metabolic disease: Secondary | ICD-10-CM | POA: Diagnosis not present

## 2019-10-29 DIAGNOSIS — F1920 Other psychoactive substance dependence, uncomplicated: Secondary | ICD-10-CM | POA: Diagnosis not present

## 2019-10-29 DIAGNOSIS — E1065 Type 1 diabetes mellitus with hyperglycemia: Secondary | ICD-10-CM | POA: Diagnosis not present

## 2019-10-29 DIAGNOSIS — E1110 Type 2 diabetes mellitus with ketoacidosis without coma: Secondary | ICD-10-CM | POA: Diagnosis present

## 2019-10-29 DIAGNOSIS — R21 Rash and other nonspecific skin eruption: Secondary | ICD-10-CM | POA: Diagnosis present

## 2019-10-29 DIAGNOSIS — Z046 Encounter for general psychiatric examination, requested by authority: Secondary | ICD-10-CM | POA: Diagnosis not present

## 2019-10-29 DIAGNOSIS — L237 Allergic contact dermatitis due to plants, except food: Secondary | ICD-10-CM | POA: Diagnosis present

## 2019-10-29 DIAGNOSIS — R197 Diarrhea, unspecified: Secondary | ICD-10-CM | POA: Diagnosis present

## 2019-10-29 DIAGNOSIS — F12988 Cannabis use, unspecified with other cannabis-induced disorder: Secondary | ICD-10-CM | POA: Diagnosis not present

## 2019-10-29 DIAGNOSIS — Z590 Homelessness: Secondary | ICD-10-CM | POA: Diagnosis not present

## 2019-10-29 DIAGNOSIS — F1120 Opioid dependence, uncomplicated: Secondary | ICD-10-CM | POA: Diagnosis present

## 2019-10-29 DIAGNOSIS — F329 Major depressive disorder, single episode, unspecified: Secondary | ICD-10-CM | POA: Diagnosis present

## 2019-10-29 DIAGNOSIS — Z5321 Procedure and treatment not carried out due to patient leaving prior to being seen by health care provider: Secondary | ICD-10-CM | POA: Diagnosis not present

## 2019-10-29 DIAGNOSIS — R3915 Urgency of urination: Secondary | ICD-10-CM | POA: Diagnosis present

## 2019-10-29 DIAGNOSIS — Z66 Do not resuscitate: Secondary | ICD-10-CM | POA: Diagnosis present

## 2019-10-29 DIAGNOSIS — Z9114 Patient's other noncompliance with medication regimen: Secondary | ICD-10-CM | POA: Diagnosis not present

## 2019-10-29 DIAGNOSIS — E1142 Type 2 diabetes mellitus with diabetic polyneuropathy: Secondary | ICD-10-CM | POA: Diagnosis present

## 2019-10-29 LAB — BASIC METABOLIC PANEL
Anion gap: 10 (ref 5–15)
BUN: 13 mg/dL (ref 6–20)
CO2: 24 mmol/L (ref 22–32)
Calcium: 8.5 mg/dL — ABNORMAL LOW (ref 8.9–10.3)
Chloride: 101 mmol/L (ref 98–111)
Creatinine, Ser: 0.83 mg/dL (ref 0.61–1.24)
GFR calc Af Amer: >60 mL/min (ref >60)
GFR calc non Af Amer: >60 mL/min (ref >60)
Glucose, Bld: 298 mg/dL — ABNORMAL HIGH (ref 70–99)
Potassium: 3.8 mmol/L (ref 3.5–5.1)
Sodium: 135 mmol/L (ref 135–145)

## 2019-10-29 LAB — PHOSPHORUS: Phosphorus: 2.9 mg/dL (ref 2.5–4.6)

## 2019-10-29 LAB — CBC
HCT: 44.6 % (ref 39.0–52.0)
Hemoglobin: 14.9 g/dL (ref 13.0–17.0)
MCH: 31.8 pg (ref 26.0–34.0)
MCHC: 33.4 g/dL (ref 30.0–36.0)
MCV: 95.3 fL (ref 80.0–100.0)
Platelets: 380 10*3/uL (ref 150–400)
RBC: 4.68 MIL/uL (ref 4.22–5.81)
RDW: 12.4 % (ref 11.5–15.5)
WBC: 6.9 10*3/uL (ref 4.0–10.5)
nRBC: 0 % (ref 0.0–0.2)

## 2019-10-29 LAB — GLUCOSE, CAPILLARY
Glucose-Capillary: 278 mg/dL — ABNORMAL HIGH (ref 70–99)
Glucose-Capillary: 364 mg/dL — ABNORMAL HIGH (ref 70–99)
Glucose-Capillary: 282 mg/dL — ABNORMAL HIGH (ref 70–99)
Glucose-Capillary: 293 mg/dL — ABNORMAL HIGH (ref 70–99)

## 2019-10-29 LAB — MAGNESIUM: Magnesium: 2 mg/dL (ref 1.7–2.4)

## 2019-10-29 MED ORDER — GABAPENTIN 400 MG PO CAPS
400.0000 mg | ORAL_CAPSULE | Freq: Three times a day (TID) | ORAL | Status: DC
  Administered 2019-10-29 – 2019-10-31 (×6): 400 mg via ORAL
  Filled 2019-10-29 (×6): qty 1

## 2019-10-29 MED ORDER — INSULIN ASPART 100 UNIT/ML ~~LOC~~ SOLN
0.0000 [IU] | Freq: Every day | SUBCUTANEOUS | Status: DC
  Administered 2019-10-29: 3 [IU] via SUBCUTANEOUS
  Administered 2019-10-30: 4 [IU] via SUBCUTANEOUS

## 2019-10-29 MED ORDER — INSULIN ASPART 100 UNIT/ML ~~LOC~~ SOLN
0.0000 [IU] | Freq: Three times a day (TID) | SUBCUTANEOUS | Status: DC
  Administered 2019-10-29: 8 [IU] via SUBCUTANEOUS
  Administered 2019-10-29: 15 [IU] via SUBCUTANEOUS
  Administered 2019-10-29: 8 [IU] via SUBCUTANEOUS
  Administered 2019-10-30 (×3): 11 [IU] via SUBCUTANEOUS
  Administered 2019-10-31: 5 [IU] via SUBCUTANEOUS
  Administered 2019-10-31: 11 [IU] via SUBCUTANEOUS

## 2019-10-29 MED ORDER — INSULIN GLARGINE 100 UNIT/ML ~~LOC~~ SOLN
12.0000 [IU] | Freq: Two times a day (BID) | SUBCUTANEOUS | Status: DC
  Administered 2019-10-29 – 2019-10-30 (×2): 12 [IU] via SUBCUTANEOUS
  Filled 2019-10-29 (×3): qty 0.12

## 2019-10-29 MED ORDER — ALUM SULFATE-CA ACETATE EX PACK
1.0000 | PACK | Freq: Three times a day (TID) | CUTANEOUS | Status: DC
  Administered 2019-10-29 – 2019-10-31 (×3): 1 via TOPICAL
  Filled 2019-10-29 (×10): qty 1

## 2019-10-29 MED ORDER — GABAPENTIN 100 MG PO CAPS
200.0000 mg | ORAL_CAPSULE | Freq: Three times a day (TID) | ORAL | Status: DC
  Administered 2019-10-29: 200 mg via ORAL
  Filled 2019-10-29: qty 2

## 2019-10-29 MED ORDER — HYDROXYZINE HCL 25 MG PO TABS
25.0000 mg | ORAL_TABLET | ORAL | Status: DC | PRN
  Administered 2019-10-29 – 2019-10-30 (×4): 25 mg via ORAL
  Filled 2019-10-29 (×4): qty 1

## 2019-10-29 MED ORDER — DIPHENHYDRAMINE HCL 25 MG PO CAPS
25.0000 mg | ORAL_CAPSULE | Freq: Four times a day (QID) | ORAL | Status: DC | PRN
  Administered 2019-10-29: 25 mg via ORAL
  Filled 2019-10-29: qty 1

## 2019-10-29 MED ORDER — INSULIN GLARGINE 100 UNIT/ML ~~LOC~~ SOLN
12.0000 [IU] | Freq: Every day | SUBCUTANEOUS | Status: DC
  Administered 2019-10-29: 12 [IU] via SUBCUTANEOUS
  Filled 2019-10-29: qty 0.12

## 2019-10-29 MED ORDER — NICOTINE 7 MG/24HR TD PT24
7.0000 mg | MEDICATED_PATCH | Freq: Every day | TRANSDERMAL | Status: DC
  Administered 2019-10-29 – 2019-10-30 (×2): 7 mg via TRANSDERMAL
  Filled 2019-10-29 (×2): qty 1

## 2019-10-29 NOTE — Social Work (Signed)
CSW was alerted by RN about patient's suicidal ideations and that he wanted to go to [facility] voluntary. CSW attempted to have patient sign voluntary admission for possible [facility] admission. Patient informed CSW that he was sleeping and was not willing to sign anything and to return later. RN also informed CSW that psych had been consulted.

## 2019-10-29 NOTE — Progress Notes (Addendum)
Family Medicine Teaching Service Daily Progress Note Intern Pager: [phone]  Patient name: Justin [name] Medical record number: [id number] Date of birth: [birth year] Age: 29 y.o. Gender: male  Primary Care Provider: Patient, No Pcp Per Consultants: None Code Status: DNR  Pt Overview and Major Events to Date:  Admitted 7/30  Assessment and Plan: Justin [name] is a 29 year old male presenting with hyperglycemia secondary to IDDM.  PMH significant for insulin-dependent diabetes, homelessness, polysubstance abuse, hyponatremia.  Mild DKA/ HHS 2/2 Poorly controlled IDDM Transitioned off Endo tool last night. S/p 10 units Lantus 1832. Off insulin drip and D5 half-normal saline 7/30 1930.  Overnight on sensitive SSI with meals and at bedtime.  Random glucose 298 at 0234.  Soft BP x2 overnight: 88/58, 92/55.  Pulse normal, SPO2 greater than 97%. -Continue normal saline mIVF -A.m. BMP glucose 298, calcium 8.5, otherwise WNL -AM CBC WNL - will increase SSI to regular from sensitive -Consider orthostatics if symptomatic -Follow-up social work consult -Consult pharmacy insulin for outpatient -Consult diabetes coordinator team -Consult to CSW: PCP/medication/resources  Poison Ivy Patient has a history of homelessness.  Patient reports sleeping in the woods.  He has a new rash covering  lower extremities, lower abdomen, genitalia, and back, bilateral arms.  Rash is red and pruritic.  Rash appears maculopapular. concerned for poison ivy vs bites from insect nest vs syphilis vs [facility] spotted fever. Decreased suspicion for [facility] spotted fever due to no fever, headaches, nor myalgias.  However it is endemic to the area and patient does report recent diarrhea and the rash fits RMSF pattern.   - Continue triamcinolone cream -Consider doxycycline 100 mg twice daily - f/u patient about timetable for rash spread  Current substance abuse  Hx polysub abuse Admission UDS positive for THC only.   History of amphetamines in the past. -Overnight COWS 0x2 -Continue monitor vital signs  Homelessness Patient has a history of homelessness and reports that he presented to the ED from the woods. He was recently in a home without power or running water with his fiance that left in a care with her grandmother. He has not been back to that house and has been sleeping in the woods. He has trouble securing insulin regularly and reports that he got this past month insulin after discharge in June from the wellness.  Patient's hospitalizations appear to be avoidable if the patient can secure a regular insulin. -Consult social work as above  FEN/GI: Carb modified PPx: Lovenox  Disposition: Progressive  Subjective:  Upon entering room, found Justin [name]. Easily awoken to voice, but kept his hospital gown covering his eyes during our conversation. He reports feeling well overnight. Says he is able to eat with a little nausea but no vomiting. Has no complaints, says he "feels okay." No diarrhea or constipation, urinating as normal.   Objective: Temp:  [97.5 F (36.4 C)-98.4 F (36.9 C)] 98.1 F (36.7 C) (07/31 0044) Pulse Rate:  [58-78] 65 (07/31 0044) Resp:  [14-19] 19 (07/31 0044) BP: (88-117)/(55-82) 92/55 (07/31 0044) SpO2:  [97 %-100 %] 100 % (07/31 0044) Weight:  [68.1 kg] 68.1 kg (07/30 1731)  Physical Exam: General: sleepy but easily awoken, oriented Cardiovascular: RRR, no murmurs auscultated Respiratory: CTAB Extremities: no BLE edema  Laboratory: Recent Labs  Lab 10/28/19 1031 10/28/19 1319 10/29/19 0234  WBC 6.5  --  6.9  HGB 13.7 15.6 14.9  HCT 43.3 46.0 44.6  PLT 323  --  380   Recent Labs  Lab 10/28/19 1031 10/28/19 1319 10/28/19 1545 10/28/19 1829 10/29/19 0234  NA 125*   < > 143 136 135  K 4.2   < > 3.8 3.3* 3.8  CL 92*   < > 105 100 101  CO2 20*   < > 27 23 24   BUN 11   < > 8 9 13   CREATININE 1.16   < > 0.71 0.83 0.83  CALCIUM 7.8*   < > 9.0 8.7*  8.5*  PROT 5.7*  --   --   --   --   BILITOT 1.2  --   --   --   --   ALKPHOS 149*  --   --   --   --   ALT 37  --   --   --   --   AST 19  --   --   --   --   GLUCOSE 998*   < > 65* 157* 298*   < > = values in this interval not displayed.     Imaging/Diagnostic Tests: No results found.   , MD 10/29/2019, 5:56 AM PGY-1, [facility] Family Medicine FPTS Intern pager: [phone], text pages welcome

## 2019-10-29 NOTE — Progress Notes (Addendum)
Patient left the unit through the fire door at about 17:20setting of the alarm. Staff tried to converse with him to let him know he could not leave the unit. The patient ignored staff and proceeded to the elevator stating "I need to walk around". Security was called and patient returned about 15-20 min later.

## 2019-10-29 NOTE — Progress Notes (Signed)
Per spiritual care consult, pt did not want to talk to chaplain, but requested a Bible.  Chaplain delivered Bible and advised Charity fundraiser.  Pt was sleeping.    Theodoro Parma [phone]     10/29/19 1600  Clinical Encounter Type  Visited With Patient not available  Visit Type Spiritual support  Referral From Patient  Consult/Referral To Chaplain  Spiritual Encounters  Spiritual Needs Sacred text  Stress Factors  Patient Stress Factors None identified

## 2019-10-29 NOTE — Progress Notes (Signed)
Patient told nurse that he is feeling depressed and suicidal. When asked if he had a plan he stated that he has been trying to come up with one for when he goes home while he was here in the hospital. He states that once he is discharged he plans to go home and take all of his insulin to kill himself. The patient states that his home life is causing his distress and that is what has lead to these thoughts. The patient wants to be transferred to behavioral health to get some help once he is medically okay to do so.

## 2019-10-29 NOTE — Progress Notes (Addendum)
Chaplain returned to talk with patient, per new spiritual care consult note.  Pt is pleasant, engaging, friendly, but did become increasingly hyper throughout the conversation. It was also apparent that the itching from poison ivy was becoming more uncomfortable for him.  Pt was reading the Bible when chaplain entered. He shared some reflections about passages that he enjoyed reading (notably [employer] and [employer]).  Chaplain facilitated story-telling:  Pt reflected on his broken marriage, drug use (particularly by wife) to escape memories of trauma, family issues, and how his mind can become like a "kaleidiscope" (his brother's term) that is too intense. Pt discussed wanting to become his own advocate instead of becoming "guilty by association" (ie hanging out with people with whom he often gets in trouble). Chaplain suggested books to read and offered possible strategies to investigate with a psychologist/psychiatrist (EMDR, meditation/mindfulness, cognitive behavioral therapies, etc.) as he seeks to overcome the difficulties of the past year. Pt requested prayer for "discernment".  Chaplain offered prayer and encouraged him to stay focused on his own recovery on the levels of physical, spiritual and emotional wholeness.   Pt requested follow-up visit, which chaplain will refer to the floor chaplain.  Pt or staff should call f/u if support is needed earlier.  Belia Heman, [place] [phone]

## 2019-10-29 NOTE — Progress Notes (Signed)
Inpatient Diabetes Program Recommendations  AACE/ADA: New Consensus Statement on Inpatient Glycemic Control (2015)  Target Ranges:  Prepandial:   less than 140 mg/dL      Peak postprandial:   less than 180 mg/dL (1-2 hours)      Critically ill patients:  140 - 180 mg/dL   Lab Results  Component Value Date   GLUCAP 278 (H) 10/29/2019   HGBA1C 13.7 (H) 09/17/2019    Review of Glycemic Control Results for NAKOA, GANUS (MRN [id number]) as of 10/29/2019 09:43  Ref. Range 10/28/2019 18:41 10/28/2019 19:40 10/28/2019 20:56 10/29/2019 08:12  Glucose-Capillary Latest Ref Range: 70 - 99 mg/dL 321 (H) 224 (H) 825 (H) 278 (H)   Diabetes history: DM2  Outpatient Diabetes medications:  Novolog 10 units TID + Lantus 20 units BID  Current orders for Inpatient glycemic control:  Lantus 10 units x 1, Novolog 0-15 units TID & Hs  Inpatient Diabetes Program Recommendations:    Increase Lantus to 12 BID. TOC consult placed. Patient already utilized Microbiologist. Inconsistency with [facility].   Thanks, Lujean Rave, MSN, RNC-OB Diabetes Coordinator [phone] (8a-5p)

## 2019-10-29 NOTE — Progress Notes (Signed)
Paged about missing patient. Returned to room. Was in bed the time Dr. Morrie Sheldon and I came to the room. He said he was outside smoking a cigarette but he will not do it anymore. Would like a low dose nicotine pack. Appreciates the good job we are doing. Voiced understanding that he is not supposed to wander around hospital without support for his safety.   Lavonda Jumbo, DO 10/29/2019, 6:02 PM PGY-2, [facility]

## 2019-10-30 DIAGNOSIS — Z8639 Personal history of other endocrine, nutritional and metabolic disease: Secondary | ICD-10-CM

## 2019-10-30 DIAGNOSIS — L237 Allergic contact dermatitis due to plants, except food: Secondary | ICD-10-CM

## 2019-10-30 DIAGNOSIS — F17203 Nicotine dependence unspecified, with withdrawal: Secondary | ICD-10-CM

## 2019-10-30 DIAGNOSIS — E1065 Type 1 diabetes mellitus with hyperglycemia: Secondary | ICD-10-CM

## 2019-10-30 DIAGNOSIS — F1210 Cannabis abuse, uncomplicated: Secondary | ICD-10-CM

## 2019-10-30 DIAGNOSIS — F12988 Cannabis use, unspecified with other cannabis-induced disorder: Secondary | ICD-10-CM

## 2019-10-30 LAB — BASIC METABOLIC PANEL
Anion gap: 9 (ref 5–15)
BUN: 15 mg/dL (ref 6–20)
CO2: 23 mmol/L (ref 22–32)
Calcium: 8.7 mg/dL — ABNORMAL LOW (ref 8.9–10.3)
Chloride: 101 mmol/L (ref 98–111)
Creatinine, Ser: 0.73 mg/dL (ref 0.61–1.24)
GFR calc Af Amer: >60 mL/min (ref >60)
GFR calc non Af Amer: >60 mL/min (ref >60)
Glucose, Bld: 334 mg/dL — ABNORMAL HIGH (ref 70–99)
Potassium: 4.4 mmol/L (ref 3.5–5.1)
Sodium: 133 mmol/L — ABNORMAL LOW (ref 135–145)

## 2019-10-30 LAB — CBC
HCT: 44.8 % (ref 39.0–52.0)
Hemoglobin: 15 g/dL (ref 13.0–17.0)
MCH: 31.7 pg (ref 26.0–34.0)
MCHC: 33.5 g/dL (ref 30.0–36.0)
MCV: 94.7 fL (ref 80.0–100.0)
Platelets: 361 10*3/uL (ref 150–400)
RBC: 4.73 MIL/uL (ref 4.22–5.81)
RDW: 12.5 % (ref 11.5–15.5)
WBC: 6.9 10*3/uL (ref 4.0–10.5)
nRBC: 0 % (ref 0.0–0.2)

## 2019-10-30 LAB — GLUCOSE, CAPILLARY
Glucose-Capillary: 319 mg/dL — ABNORMAL HIGH (ref 70–99)
Glucose-Capillary: 310 mg/dL — ABNORMAL HIGH (ref 70–99)
Glucose-Capillary: 331 mg/dL — ABNORMAL HIGH (ref 70–99)
Glucose-Capillary: 327 mg/dL — ABNORMAL HIGH (ref 70–99)

## 2019-10-30 MED ORDER — NICOTINE 21 MG/24HR TD PT24
21.0000 mg | MEDICATED_PATCH | Freq: Every day | TRANSDERMAL | Status: DC
  Administered 2019-10-30 – 2019-10-31 (×2): 21 mg via TRANSDERMAL
  Filled 2019-10-30 (×2): qty 1

## 2019-10-30 MED ORDER — ALPRAZOLAM 0.5 MG PO TABS
1.0000 mg | ORAL_TABLET | Freq: Two times a day (BID) | ORAL | Status: DC | PRN
  Administered 2019-10-30 – 2019-10-31 (×3): 1 mg via ORAL
  Filled 2019-10-30 (×3): qty 2

## 2019-10-30 MED ORDER — INSULIN GLARGINE 100 UNIT/ML ~~LOC~~ SOLN
20.0000 [IU] | Freq: Two times a day (BID) | SUBCUTANEOUS | Status: DC
  Administered 2019-10-30 – 2019-10-31 (×2): 20 [IU] via SUBCUTANEOUS
  Filled 2019-10-30 (×3): qty 0.2

## 2019-10-30 MED ORDER — INSULIN GLARGINE 100 UNIT/ML ~~LOC~~ SOLN
15.0000 [IU] | Freq: Two times a day (BID) | SUBCUTANEOUS | Status: DC
  Filled 2019-10-30: qty 0.15

## 2019-10-30 NOTE — Consult Note (Signed)
[facility] Face-to-Face Psychiatry Consult   Reason for Consult:  ''thoughts of harming self'' Referring Physician: Denny Levy, MD Patient Identification: Justin [name] MRN:  [id number] Principal Diagnosis: Cannabis-induced mood disorder (HCC) Diagnosis:  Principal Problem:   Cannabis-induced mood disorder (HCC) Active Problems:   DKA (diabetic ketoacidoses) (HCC)   Cannabis use disorder, mild, abuse   Total Time spent with patient: 1 hour  Subjective:   Justin [name] is a 29 y.o. male patient admitted with hyperglycemia.  HPI:  29 y.o. male with reports  medical history significant for DM insulin dependent, Hyponatremia, homelessness and Cannabis abuse who denies prior history of mental illness. Patient reports that he came to the hospital for evaluation and treatment for elevated blood sugars. He reports being homeless due to his inability to get along with his family. He states that being home less has made it difficult for him to regulate his blood sugar as expected and would like to talk to the social worker regarding shelter. He denies daily illicit substance or alcohol abuse. He reports smoking Cannabis occasionally and used Methamphetamine once in June, 2021. Today, he is alert, oriented, cooperative, calm , denies psychosis, delusions or self harming thoughts.  Past Psychiatric History: as above  Risk to Self:  denies Risk to Others:  denies Prior Inpatient Therapy:  none reported by the patient  Prior Outpatient Therapy:  denies  Past Medical History:  Past Medical History:  Diagnosis Date  . Diabetes mellitus without complication (HCC)   . DKA (diabetic ketoacidoses) (HCC) 10/28/2019    Past Surgical History:  Procedure Laterality Date  . EYE SURGERY    . right hand surgery.    . TENDON REPAIR     Family History:  Family History  Problem Relation Age of Onset  . Diabetes Mellitus II Neg Hx    Family Psychiatric  History:  Social History:  Social History   Substance  and Sexual Activity  Alcohol Use Yes     Social History   Substance and Sexual Activity  Drug Use Yes  . Types: Marijuana    Social History   Socioeconomic History  . Marital status: Single    Spouse name: Not on file  . Number of children: Not on file  . Years of education: Not on file  . Highest education level: Not on file  Occupational History  . Not on file  Tobacco Use  . Smoking status: Current Every Day Smoker    Packs/day: 0.25    Types: Cigarettes  . Smokeless tobacco: Never Used  Vaping Use  . Vaping Use: Never used  Substance and Sexual Activity  . Alcohol use: Yes  . Drug use: Yes    Types: Marijuana  . Sexual activity: Not Currently  Other Topics Concern  . Not on file  Social History Narrative  . Not on file   Social Determinants of Health   Financial Resource Strain:   . Difficulty of Paying Living Expenses:   Food Insecurity:   . Worried About Programme researcher, broadcasting/film/video in the Last Year:   . Barista in the Last Year:   Transportation Needs:   . Freight forwarder (Medical):   Marland Kitchen Lack of Transportation (Non-Medical):   Physical Activity:   . Days of Exercise per Week:   . Minutes of Exercise per Session:   Stress:   . Feeling of Stress :   Social Connections:   . Frequency of Communication with Friends and Family:   .  Frequency of Social Gatherings with Friends and Family:   . Attends Religious Services:   . Active Member of Clubs or Organizations:   . Attends Banker Meetings:   Marland Kitchen Marital Status:    Additional Social History:    Allergies:  No Known Allergies  Labs:  Results for orders placed or performed during the hospital encounter of 10/28/19 (from the past 48 hour(s))  CBG monitoring, ED     Status: Abnormal   Collection Time: 10/28/19  1:01 PM  Result Value Ref Range   Glucose-Capillary 408 (H) 70 - 99 mg/dL    Comment: Glucose reference range applies only to samples taken after fasting for at least 8 hours.    Comment 1 Notify RN    Comment 2 Document in Chart   I-Stat venous blood gas, ([facility] ED)     Status: Abnormal   Collection Time: 10/28/19  1:19 PM  Result Value Ref Range   pH, Ven 7.375 7.25 - 7.43   pCO2, Ven 51.0 44 - 60 mmHg   pO2, Ven 41.0 32 - 45 mmHg   Bicarbonate 29.8 (H) 20.0 - 28.0 mmol/L   TCO2 31 22 - 32 mmol/L   O2 Saturation 75.0 %   Acid-Base Excess 3.0 (H) 0.0 - 2.0 mmol/L   Sodium 138 135 - 145 mmol/L   Potassium 4.0 3.5 - 5.1 mmol/L   Calcium, Ion 1.16 1.15 - 1.40 mmol/L   HCT 46.0 39 - 52 %   Hemoglobin 15.6 13.0 - 17.0 g/dL   Sample type VENOUS   SARS Coronavirus 2 by RT PCR (hospital order, performed in [facility] hospital lab) Nasopharyngeal Nasopharyngeal Swab     Status: None   Collection Time: 10/28/19  1:25 PM   Specimen: Nasopharyngeal Swab  Result Value Ref Range   SARS Coronavirus 2 NEGATIVE NEGATIVE    Comment: (NOTE) SARS-CoV-2 target nucleic acids are NOT DETECTED.  The SARS-CoV-2 RNA is generally detectable in upper and lower respiratory specimens during the acute phase of infection. The lowest concentration of SARS-CoV-2 viral copies this assay can detect is 250 copies / mL. A negative result does not preclude SARS-CoV-2 infection and should not be used as the sole basis for treatment or other patient management decisions.  A negative result may occur with improper specimen collection / handling, submission of specimen other than nasopharyngeal swab, presence of viral mutation(s) within the areas targeted by this assay, and inadequate number of viral copies (<250 copies / mL). A negative result must be combined with clinical observations, patient history, and epidemiological information.  Fact Sheet for Patients:   BoilerBrush.com.cy  Fact Sheet for Healthcare Providers: https://pope.com/  This test is not yet approved or  cleared by the [place] FDA and has been authorized for  detection and/or diagnosis of SARS-CoV-2 by FDA under an Emergency Use Authorization (EUA).  This EUA will remain in effect (meaning this test can be used) for the duration of the COVID-19 declaration under Section 564(b)(1) of the Act, 21 U.S.C. section 360bbb-3(b)(1), unless the authorization is terminated or revoked sooner.  Performed at [facility], [address], [place], [place] [address]   CBG monitoring, ED     Status: Abnormal   Collection Time: 10/28/19  1:47 PM  Result Value Ref Range   Glucose-Capillary 291 (H) 70 - 99 mg/dL    Comment: Glucose reference range applies only to samples taken after fasting for at least 8 hours.  CBG monitoring, ED  Status: None   Collection Time: 10/28/19  2:58 PM  Result Value Ref Range   Glucose-Capillary 92 70 - 99 mg/dL    Comment: Glucose reference range applies only to samples taken after fasting for at least 8 hours.  Basic metabolic panel     Status: Abnormal   Collection Time: 10/28/19  3:45 PM  Result Value Ref Range   Sodium 143 135 - 145 mmol/L   Potassium 3.8 3.5 - 5.1 mmol/L   Chloride 105 98 - 111 mmol/L   CO2 27 22 - 32 mmol/L   Glucose, Bld 65 (L) 70 - 99 mg/dL    Comment: Glucose reference range applies only to samples taken after fasting for at least 8 hours.   BUN 8 6 - 20 mg/dL   Creatinine, Ser 1.61 0.61 - 1.24 mg/dL   Calcium 9.0 8.9 - 09.6 mg/dL   GFR calc non Af Amer >60 >60 mL/min   GFR calc Af Amer >60 >60 mL/min   Anion gap 11 5 - 15    Comment: Performed at [facility], [address] [place], [place] [address]  Beta-hydroxybutyric acid     Status: Abnormal   Collection Time: 10/28/19  3:45 PM  Result Value Ref Range   Beta-Hydroxybutyric Acid 0.42 (H) 0.05 - 0.27 mmol/L    Comment: Performed at [facility], [address], [place], [place] [address]  Magnesium     Status: None   Collection Time: 10/28/19  3:45 PM  Result Value Ref Range   Magnesium 2.2 1.7 - 2.4 mg/dL     Comment: Performed at [facility], [address] [place], [place] [address]  Phosphorus     Status: None   Collection Time: 10/28/19  3:45 PM  Result Value Ref Range   Phosphorus 2.6 2.5 - 4.6 mg/dL    Comment: Performed at [facility], [address] [place], [place] [address]  CBG monitoring, ED     Status: None   Collection Time: 10/28/19  3:58 PM  Result Value Ref Range   Glucose-Capillary 73 70 - 99 mg/dL    Comment: Glucose reference range applies only to samples taken after fasting for at least 8 hours.   Comment 1 Notify RN    Comment 2 Document in Chart   Glucose, capillary     Status: Abnormal   Collection Time: 10/28/19  5:30 PM  Result Value Ref Range   Glucose-Capillary 132 (H) 70 - 99 mg/dL    Comment: Glucose reference range applies only to samples taken after fasting for at least 8 hours.  Basic metabolic panel     Status: Abnormal   Collection Time: 10/28/19  6:29 PM  Result Value Ref Range   Sodium 136 135 - 145 mmol/L    Comment: DELTA CHECK NOTED   Potassium 3.3 (L) 3.5 - 5.1 mmol/L   Chloride 100 98 - 111 mmol/L   CO2 23 22 - 32 mmol/L   Glucose, Bld 157 (H) 70 - 99 mg/dL    Comment: Glucose reference range applies only to samples taken after fasting for at least 8 hours.   BUN 9 6 - 20 mg/dL   Creatinine, Ser 3.08 0.61 - 1.24 mg/dL   Calcium 8.7 (L) 8.9 - 10.3 mg/dL   GFR calc non Af Amer >60 >60 mL/min   GFR calc Af Amer >60 >60 mL/min   Anion gap 13 5 - 15  Comment: Performed at [facility], [address] [place], [place] [address]  Beta-hydroxybutyric acid     Status: Abnormal   Collection Time: 10/28/19  6:29 PM  Result Value Ref Range   Beta-Hydroxybutyric Acid 1.50 (H) 0.05 - 0.27 mmol/L    Comment: Performed at [facility], [address] [place], [place] [address]  Glucose, capillary     Status: Abnormal   Collection Time: 10/28/19  6:41 PM  Result Value Ref Range   Glucose-Capillary 162 (H) 70 - 99 mg/dL     Comment: Glucose reference range applies only to samples taken after fasting for at least 8 hours.  Glucose, capillary     Status: Abnormal   Collection Time: 10/28/19  7:40 PM  Result Value Ref Range   Glucose-Capillary 331 (H) 70 - 99 mg/dL    Comment: Glucose reference range applies only to samples taken after fasting for at least 8 hours.  Glucose, capillary     Status: Abnormal   Collection Time: 10/28/19  8:56 PM  Result Value Ref Range   Glucose-Capillary 281 (H) 70 - 99 mg/dL    Comment: Glucose reference range applies only to samples taken after fasting for at least 8 hours.  Basic metabolic panel     Status: Abnormal   Collection Time: 10/29/19  2:34 AM  Result Value Ref Range   Sodium 135 135 - 145 mmol/L   Potassium 3.8 3.5 - 5.1 mmol/L   Chloride 101 98 - 111 mmol/L   CO2 24 22 - 32 mmol/L   Glucose, Bld 298 (H) 70 - 99 mg/dL    Comment: Glucose reference range applies only to samples taken after fasting for at least 8 hours.   BUN 13 6 - 20 mg/dL   Creatinine, Ser 2.720.83 0.61 - 1.24 mg/dL   Calcium 8.5 (L) 8.9 - 10.3 mg/dL   GFR calc non Af Amer >60 >60 mL/min   GFR calc Af Amer >60 >60 mL/min   Anion gap 10 5 - 15    Comment: Performed at [facility], [address] [place], [place] [address]  CBC     Status: None   Collection Time: 10/29/19  2:34 AM  Result Value Ref Range   WBC 6.9 4.0 - 10.5 K/uL   RBC 4.68 4.22 - 5.81 MIL/uL   Hemoglobin 14.9 13.0 - 17.0 g/dL   HCT 40.344.6 39 - 52 %   MCV 95.3 80.0 - 100.0 fL   MCH 31.8 26.0 - 34.0 pg   MCHC 33.4 30.0 - 36.0 g/dL   RDW 47.412.4 25.911.5 - 56.315.5 %   Platelets 380 150 - 400 K/uL   nRBC 0.0 0.0 - 0.2 %    Comment: Performed at [facility], [address] [place], [place] [address]  Phosphorus     Status: None   Collection Time: 10/29/19  2:34 AM  Result Value Ref Range   Phosphorus 2.9 2.5 - 4.6 mg/dL    Comment: Performed at [facility], [address] [place], [place] [address]  Magnesium      Status: None   Collection Time: 10/29/19  2:34 AM  Result Value Ref Range   Magnesium 2.0 1.7 - 2.4 mg/dL    Comment: Performed at [facility], [address] [place], [place] [address]  Glucose, capillary     Status: Abnormal   Collection Time: 10/29/19  8:12 AM  Result Value Ref  Range   Glucose-Capillary 278 (H) 70 - 99 mg/dL    Comment: Glucose reference range applies only to samples taken after fasting for at least 8 hours.  Glucose, capillary     Status: Abnormal   Collection Time: 10/29/19 12:01 PM  Result Value Ref Range   Glucose-Capillary 364 (H) 70 - 99 mg/dL    Comment: Glucose reference range applies only to samples taken after fasting for at least 8 hours.  Glucose, capillary     Status: Abnormal   Collection Time: 10/29/19  4:25 PM  Result Value Ref Range   Glucose-Capillary 282 (H) 70 - 99 mg/dL    Comment: Glucose reference range applies only to samples taken after fasting for at least 8 hours.   Comment 1 Notify RN    Comment 2 Document in Chart   Glucose, capillary     Status: Abnormal   Collection Time: 10/29/19  9:51 PM  Result Value Ref Range   Glucose-Capillary 293 (H) 70 - 99 mg/dL    Comment: Glucose reference range applies only to samples taken after fasting for at least 8 hours.  Basic metabolic panel     Status: Abnormal   Collection Time: 10/30/19  8:02 AM  Result Value Ref Range   Sodium 133 (L) 135 - 145 mmol/L   Potassium 4.4 3.5 - 5.1 mmol/L   Chloride 101 98 - 111 mmol/L   CO2 23 22 - 32 mmol/L   Glucose, Bld 334 (H) 70 - 99 mg/dL    Comment: Glucose reference range applies only to samples taken after fasting for at least 8 hours.   BUN 15 6 - 20 mg/dL   Creatinine, Ser 0.86 0.61 - 1.24 mg/dL   Calcium 8.7 (L) 8.9 - 10.3 mg/dL   GFR calc non Af Amer >60 >60 mL/min   GFR calc Af Amer >60 >60 mL/min   Anion gap 9 5 - 15    Comment: Performed at [facility], [address], [place], [place] [address]  CBC     Status: None    Collection Time: 10/30/19  8:02 AM  Result Value Ref Range   WBC 6.9 4.0 - 10.5 K/uL   RBC 4.73 4.22 - 5.81 MIL/uL   Hemoglobin 15.0 13.0 - 17.0 g/dL   HCT 96.2 39 - 52 %   MCV 94.7 80.0 - 100.0 fL   MCH 31.7 26.0 - 34.0 pg   MCHC 33.5 30.0 - 36.0 g/dL   RDW 95.2 84.1 - 32.4 %   Platelets 361 150 - 400 K/uL   nRBC 0.0 0.0 - 0.2 %    Comment: Performed at [facility], [address], [place], [place] [address]    Current Facility-Administered Medications  Medication Dose Route Frequency Provider Last Rate Last Admin  . 0.9 %  sodium chloride infusion   Intravenous Continuous Meccariello, Solmon Ice, DO   Stopped at 10/30/19 0047  . acetaminophen (TYLENOL) tablet 650 mg  650 mg Oral Q6H PRN Autry-Lott, Simone, DO   650 mg at 10/29/19 1230   Or  . acetaminophen (TYLENOL) suppository 650 mg  650 mg Rectal Q6H PRN Autry-Lott, Simone, DO      . aluminum sulfate-calcium acetate (DOMEBORO) packet 1 packet  1 packet Topical TID Nestor Ramp, MD   1 packet at 10/30/19 [phone]  . dextrose 50 % solution 0-50 mL  0-50 mL Intravenous PRN Autry-Lott, Simone, DO      . enoxaparin (LOVENOX)  injection 40 mg  40 mg Subcutaneous Q24H Autry-Lott, Simone, DO   40 mg at 10/29/19 1803  . gabapentin (NEURONTIN) capsule 400 mg  400 mg Oral TID Nestor Ramp, MD   400 mg at 10/30/19 0904  . hydrOXYzine (ATARAX/VISTARIL) tablet 25 mg  25 mg Oral Q4H PRN Nestor Ramp, MD   25 mg at 10/30/19 0904  . insulin aspart (novoLOG) injection 0-15 Units  0-15 Units Subcutaneous TID WC F[place] Pho, MD   11 Units at 10/30/19 [phone]  . insulin aspart (novoLOG) injection 0-5 Units  0-5 Units Subcutaneous QHS [place] Pho, MD   3 Units at 10/29/19 2207  . insulin glargine (LANTUS) injection 15 Units  15 Units Subcutaneous BID Peggyann Shoals C, DO      . nicotine (NICODERM CQ - dosed in mg/24 hr) patch 7 mg  7 mg Transdermal Daily Autry-Lott, Simone, DO   7 mg at 10/30/19 0903  . potassium chloride 10 mEq in 100 mL IVPB   10 mEq Intravenous Q1H PRN Autry-Lott, Simone, DO      . triamcinolone ointment (KENALOG) 0.1 %   Topical BID Nestor Ramp, MD   Given at 10/30/19 0047    Musculoskeletal: Strength & Muscle Tone: within normal limits Gait & Station: normal Patient leans: N/A  Psychiatric Specialty Exam: Physical Exam Psychiatric:        Attention and Perception: Attention normal.        Mood and Affect: Mood normal.        Speech: Speech normal.        Behavior: Behavior is cooperative.        Thought Content: Thought content normal.        Cognition and Memory: Cognition and memory normal.        Judgment: Judgment is impulsive.     Review of Systems  Constitutional: Negative.   HENT: Negative.   Psychiatric/Behavioral: Negative.     Blood pressure 107/67, pulse 69, temperature 98.4 F (36.9 C), temperature source Oral, resp. rate 18, height  (1.727 m), weight 68.1 kg, SpO2 100 %.Body mass index is 22.84 kg/m.  General Appearance: Casual  Eye Contact:  Good  Speech:  Clear and Coherent  Volume:  Normal  Mood:  Euthymic  Affect:  Appropriate and Congruent  Thought Process:  Coherent and Linear  Orientation:  Full (Time, Place, and Person)  Thought Content:  Logical  Suicidal Thoughts:  No  Homicidal Thoughts:  No  Memory:  Immediate;   Good Recent;   Good Remote;   Good  Judgement:  Intact  Insight:  Fair  Psychomotor Activity:  Normal  Concentration:  Concentration: Fair and Attention Span: Fair  Recall:  Good  Fund of Knowledge:  Good  Language:  Good  Akathisia:  No  Handed:  Right  AIMS (if indicated):     Assets:  Communication Skills Desire for Improvement  ADL's:  Intact  Cognition:  WNL  Sleep:        Treatment Plan Summary: 29 year old male who was admitted due to elevated blood sugar. He reportedly talked about suicidal thoughts which he currently denies. Patient reports that his stressor is getting a place to stay and getting his blood sugar under control.  He is requesting to talk to a Child psychotherapist about housing/shelter.  Recommendations: -Continue Gabapentin 400 mg tid for mood -Consider social worker consult  Disposition: No evidence of imminent risk to self or others at present.  Patient does not meet criteria for psychiatric inpatient admission. Supportive therapy provided about ongoing stressors. Psychiatric service siging out. Re-consult as needed  Thedore Mins, MD 10/30/2019 12:08 PM

## 2019-10-30 NOTE — Progress Notes (Addendum)
Interim progress note  Received a call from RN on the floor at 2:24 PM regarding patient being very upset, walking up and down the hallways, trying to leave to go smoke a cigarette, being very belligerent and cussing at nursing staff. Dr. Idalia Needle and I (residents for the family medicine teaching service) ran upstairs to [address] E. to find Mr. Remmers walking out of his room very quickly with the intention of leaving, stating he was upset and wanted to go outside and smoke a cigarette so that he can calm down.  Managed to get the patient back into his room.  He remained very flustered and stated that all he wanted to do was get out of here, go home, wanted to be with his mom, was very upset because he is currently going through divorce and that his wife is currently missing.  He is also very upset because his skin is so itchy from the poison ivy.  He is having intense withdrawals right now from nicotine, reports that he occasionally smokes marijuana to calm himself down, which he obviously cannot have while he is currently in the hospital.  Patient agreed to calm down and staying in his room, I told him we could give him a nicotine patch which he replied, does not work).  I informed him we could increase the dosage of his nicotine patch to try and see if that will be helpful, since is the best thing we have to offer him.  Also asked if I could offer him anything to calm him down, such as Xanax to which he agreed.  Also warned him and instructed him that assaulting a healthcare worker is a felony and did not raise his voice or be rude to any of the hospital or nursing staff.  Patient reported that was not his intention, he is just very frustrated right now.  I offered my empathy, the patient was able to take some nice deep breaths and calm down.  Per most recent note from psychiatry, patient is no longer expressing suicidal ideation and is therefore no longer a candidate for behavioral health.  Therefore, the patient  decides to leave AMA and he can do so as he has capacity.  Encounter was discussed with nursing staff, patient was asking for diet, caffeine free Coke, which they were able to provide for him.  Also agreed to increasing dosage of nicotine patch and giving a dose of Xanax.  Agreed that a safety sitter would no longer be in the patient's room as he is no longer suicidal and we believe that the presence of a sitter is contributing to his frustration.  Nursing staff also told that if the patient decides to leave that he can go as it is not worth risking the health and safety of healthcare staff.  Please feel free to call back should you need any help, assistance, direct line [phone], or if unable to reach can page [phone].  Peggyann Shoals, DO [facility] Family Medicine, PGY-3 10/30/2019 3:32 PM

## 2019-10-30 NOTE — Progress Notes (Signed)
Family Medicine Teaching Service Daily Progress Note Intern Pager: [phone]  Patient name: Justin [name] Medical record number: [id number] Date of birth: 1990/12/12 Age: 29 y.o. Gender: male  Primary Care Provider: Patient, No Pcp Per Consultants: None Code Status: DNR  Pt Overview and Major Events to Date:  Admitted 7/30  Assessment and Plan: Mr. Justin [name] is a 29 year old male presenting with hyperglycemia secondary to IDDM.  PMH significant for insulin-dependent diabetes, homelessness, polysubstance abuse, hyponatremia.  Poorly controlled IDDM, improving  DKA/HHS, resolved Transitioned off Endo tool 7/30.  Glucose this a.m. 334 for which he received 12 units of Lantus, 11 units of NovoLog.  Repeat blood sugar at 12 noon 310 for which he was given 11 units NovoLog.  We will increase baseline Lantus to 20 units twice daily. -Discontinued normal saline as patient now without IV -Follow-up a.m. CBC, CMP -Moderate sliding scale insulin -Lantus 20u BID -Carb modified diet -Follow-up social work consult re PCP/medication/resources -Consult pharmacy insulin for outpatient -Consult diabetes coordinator team  Sudden bouts of agitation: Per nursing staff, and also witnessed by physicians, patient with occasional severe abrupt bouts of agitation due to various causes (anxiety, desire to smoke cigarettes, wanting to go home to be with his family, concerned that his wife/fiance is missing, itchiness due to poison ivy, etc.).  During these bouts of agitation nursing staff tends to receive brunt of his anger. -Patient not IVC'ed. can leave AMA as he chooses -Given Xanax 1 mg twice daily as needed for anxiety, agitation -Social work consulted regarding personal, medication, and housing difficulties -Patient redirectable but only with strong effort  Poison Ivy, diffuse, slightly improved Patient with diffuse pruritic, erythematous rash to lower extremities, abdomen, genitalia, back, bilateral upper  extremities.  History of homelessness, sleeps in the woods.  Rash most likely due to poison ivy.  RMSF less likely due to no fever, headaches, however patient does report recent diarrhea and rash fits RMSF pattern.  -Continue triamcinolone cream -Aluminum sulfate-calcium acetate topical -Hydroxyzine -Consider doxycycline 100 mg twice daily -f/u patient about timetable for rash spread  Current substance abuse  Hx polysub abuse Admission UDS positive for THC only.  History of amphetamines in the past. -Overnight COWS 0x2 -Continue monitor vital signs -Nicotine patch 21 mg daily  Homelessness Patient has a history of homelessness and reports that he presented to the ED from the woods. He was recently in a home without power or running water with his fiance/wife? He has not been back to that house and has been sleeping in the woods. He has trouble securing insulin regularly and reports that he got this past month insulin after discharge in June. Patient's hospitalizations appear to be avoidable if the patient can secure a regular insulin. -Consult social work as above  FEN/GI: Carb modified PPx: Lovenox  Disposition: Progressive  Subjective:  Patient seen resting comfortably in bed, was in bed negative with blanket lying over him, which she reports is more comfortable with his diffuse itchy rash.  States he is feeling okay this morning, thinks rash is a lot better than the day before.  Objective: Temp:  [98.4 F (36.9 C)] 98.4 F (36.9 C) (07/31 2042) Resp:  [18] 18 (07/31 2042) BP: (107)/(67) 107/67 (07/31 2042) SpO2:  [100 %] 100 % (07/31 2042)  Physical Exam: General: Pleasant this morning, nontoxic-appearing Cardiovascular: RRR, clear S1-S2 present, no murmurs auscultated Respiratory: CTA bilaterally Integumentary: Diffuse blanchable pruritic maculopapular rash appreciated from chest/upper extremities down to bilateral lower extremities   Laboratory: Recent  Labs  Lab  10/28/19 1031 10/28/19 1031 10/28/19 1319 10/29/19 0234 10/30/19 0802  WBC 6.5  --   --  6.9 6.9  HGB 13.7   < > 15.6 14.9 15.0  HCT 43.3   < > 46.0 44.6 44.8  PLT 323  --   --  380 361   < > = values in this interval not displayed.   Recent Labs  Lab 10/28/19 1031 10/28/19 1319 10/28/19 1829 10/29/19 0234 10/30/19 0802  NA 125*   < > 136 135 133*  K 4.2   < > 3.3* 3.8 4.4  CL 92*   < > 100 101 101  CO2 20*   < > 23 24 23   BUN 11   < > 9 13 15   CREATININE 1.16   < > 0.83 0.83 0.73  CALCIUM 7.8*   < > 8.7* 8.5* 8.7*  PROT 5.7*  --   --   --   --   BILITOT 1.2  --   --   --   --   ALKPHOS 149*  --   --   --   --   ALT 37  --   --   --   --   AST 19  --   --   --   --   GLUCOSE 998*   < > 157* 298* 334*   < > = values in this interval not displayed.    Imaging/Diagnostic Tests: No results found.   , DO 10/30/2019, 5:04 PM PGY-3, [facility] FPTS Intern pager: [phone], text pages welcome

## 2019-10-30 NOTE — Progress Notes (Signed)
Pt acting out in hallway, cursing and screaming that he wanted to go smoke a cigarette. 4 nurses tried to get pt to return to his room and he just kept yelling trying to leaving the unit. Security called. Pt pulled out IV in front of security. Pt finally returned to room with Dr. Dareen Piano who had been called to assist. Pt very upset about having a sitter. Sitter discontinued based on psych rec. Pt now resting calmly in bed after speaking with Dr. Dareen Piano. Emelda Brothers RN

## 2019-10-30 NOTE — Progress Notes (Signed)
0250 Pt yelling in room that could be heard down opposite end of hall on department. Upon entering pt room, pt in the shower with sitter in room.  Nursing staff around pt room stated pt started yelling because he was incontinent and embarrassed.  Pt came out of shower with towel wrapped around waist and to door threshold yelling at the charge nurse, "I am 29 years old and I shit myself in front of this man and I am naked and embarrassed and that is why I want to kill myself". He also hit the room door with his left hand as he was making this statement.  Security was notified.  Dr. Allena Katz notified.  Pt calmed down and lying in bed quietly upon security arrival and when MD returned call.  MD also notified patient not wearing telemetry.  No further orders at this time.  MD instructed to call back if patient acts in this manner again.

## 2019-10-31 ENCOUNTER — Emergency Department (HOSPITAL_COMMUNITY)
Admission: EM | Admit: 2019-10-31 | Discharge: 2019-10-31 | Disposition: A | Discharge status: Home / Self Care | Payer: Medicaid - Out of State | Attending: Emergency Medicine | Admitting: Emergency Medicine

## 2019-10-31 ENCOUNTER — Telehealth: Payer: Self-pay

## 2019-10-31 ENCOUNTER — Other Ambulatory Visit: Payer: Self-pay

## 2019-10-31 DIAGNOSIS — Z5321 Procedure and treatment not carried out due to patient leaving prior to being seen by health care provider: Secondary | ICD-10-CM | POA: Insufficient documentation

## 2019-10-31 DIAGNOSIS — Z046 Encounter for general psychiatric examination, requested by authority: Secondary | ICD-10-CM | POA: Insufficient documentation

## 2019-10-31 DIAGNOSIS — R21 Rash and other nonspecific skin eruption: Secondary | ICD-10-CM | POA: Insufficient documentation

## 2019-10-31 LAB — BASIC METABOLIC PANEL
Anion gap: 9 (ref 5–15)
BUN: 20 mg/dL (ref 6–20)
CO2: 25 mmol/L (ref 22–32)
Calcium: 8.6 mg/dL — ABNORMAL LOW (ref 8.9–10.3)
Chloride: 102 mmol/L (ref 98–111)
Creatinine, Ser: 0.69 mg/dL (ref 0.61–1.24)
GFR calc Af Amer: >60 mL/min (ref >60)
GFR calc non Af Amer: >60 mL/min (ref >60)
Glucose, Bld: 295 mg/dL — ABNORMAL HIGH (ref 70–99)
Potassium: 3.9 mmol/L (ref 3.5–5.1)
Sodium: 136 mmol/L (ref 135–145)

## 2019-10-31 LAB — GLUCOSE, CAPILLARY
Glucose-Capillary: 308 mg/dL — ABNORMAL HIGH (ref 70–99)
Glucose-Capillary: 222 mg/dL — ABNORMAL HIGH (ref 70–99)

## 2019-10-31 LAB — CBC
HCT: 45.3 % (ref 39.0–52.0)
Hemoglobin: 15.1 g/dL (ref 13.0–17.0)
MCH: 31.7 pg (ref 26.0–34.0)
MCHC: 33.3 g/dL (ref 30.0–36.0)
MCV: 95 fL (ref 80.0–100.0)
Platelets: 363 10*3/uL (ref 150–400)
RBC: 4.77 MIL/uL (ref 4.22–5.81)
RDW: 12.4 % (ref 11.5–15.5)
WBC: 7.5 10*3/uL (ref 4.0–10.5)
nRBC: 0 % (ref 0.0–0.2)

## 2019-10-31 MED ORDER — POLYETHYLENE GLYCOL 3350 17 G PO PACK: 17.0000 g | PACK | Freq: Every day | ORAL | Status: DC

## 2019-10-31 NOTE — Progress Notes (Signed)
Pt came up hall screaming and yelling. I asked him to not leave the floor and he proceeded to tell me he was leaving, with many explicit words. He then proceeded to get on the elevator and leave. I notified the family medicine team. Security came and got his belongings, they stated he was at the ED asking for them. Emelda Brothers RN

## 2019-10-31 NOTE — ED Notes (Addendum)
Pt seen walking out with belongings in burgundy scrubs

## 2019-10-31 NOTE — ED Triage Notes (Signed)
Pt checking into ED after leaving AMA from 6E for a rash all over his body. Pt pacing and yelling in triage.

## 2019-10-31 NOTE — Progress Notes (Signed)
Late Entry Note Progress Note   Was informed that patient was seen walking in maroon scrubs, leaving the hospital. Patient reported to have been in ED on 8/2 and was discharged as of 18:32. Unclear as to why patient decided to leave.  Ronnald Ramp, MD  FPTS  PGY-2 FPTS Intern Pager [phone]

## 2019-10-31 NOTE — Discharge Summary (Deleted)
Error

## 2019-10-31 NOTE — Hospital Course (Addendum)
Justin [name] is a 29 year old male who presented with hyperglycemia secondary to IDDM determined to be in DKA vs HHS.  PMH significant for insulin-dependent diabetes, homelessness, polysubstance abuse, hyponatremia.  Poorly controlled IDDM, improving  DKA/HHS, resolved Patient presented to ED with BGL >600 and repeat 998.  BHB at presentation was 3.43.  UA was significant for ketones (20) and glucosuria >500.  Patient reported he ran out of insulin 1 day prior to presentation to ED.  Patient placed on DKA protocol.  Once patient was out of DKA he was transitioned to insulin off the drip and provided diet.  Patient's home diabetic regimen was unclear despite patient .At time of leaving AGAINST MEDICAL ADVICE patient was on on 20 units Lantus twice daily.   Sudden bouts of agitation- worsened Patient was noted to be irritated and easily agitated on initial exam.  Patient initially reported SI and required a sitter; however, patient became more agitated with sitter.  Patient denied suicidal ideation and sitter was removed.  Patient had one event of leaving the floor.  Prior to this event patient told nurses he would come back in 20 minutes.  Police had to be called and the team was paged.  Patient returned and reported that he had gone out for smoke.  Team was able to convince patient to try low-dose nicotine patch which was later increased to 21 mg.  Treatment team often had to redirect patient.  Patient did require 2 mg total of Xanax for agitation to which she responded well.  However, patient had an outburst and was seen coming up the hall yelling expletives at nurses and cops had to be called.  Patient left AMA.  Per nurse notes, patient was later found in the ED yelling about having no close having left him upstairs.  Police had to retrieve close from patient room from during hospital stay.   Poison Ivy, diffuse, slightly improved Patient received Atarax and triamcinolone cream.  Patient pruritus  appeared to be improving in response to medications however uncertain due to patient's sudden outbursts and leaving AMA.  Current substance abuse  Hx polysub abuse UDS at presentation was positive for THC.  Due to history of polysubstance use being confirmed in previous UDS patient was placed on COWS.  Scores throughout hospitalization never required treatment.  Homelessness Study was consulted and pharmacy attempted to create an avenue for patient to get his insulin outpatient.  Patient left AMA without a plan for refilling his insulin.

## 2019-10-31 NOTE — Telephone Encounter (Signed)
Patient came into the clinic as clinic was getting ready to close.  He had been in the ED but said that he couldn't stay because nothing could be done about his rash.  [facility] closed.  He spoke to [employer], [facility] and said he would return tomorrow to pick up his insulin.  He said that he has enough novolog at this time.   He also noted that he is homeless.  He said that his mother is planning to get him a bus ticket to [place] to visit her for his birthday this week.  He said he does not know where his wife is and her family does not know where she is.   He requested food and and was given some food from [facility] food pantry. He was also given bus ticket.

## 2019-10-31 NOTE — Progress Notes (Signed)
Family Medicine Teaching Service Daily Progress Note Intern Pager: [phone]  Patient name: Justin [name] Medical record number: [id number] Date of birth: May 06, 1990 Age: 29 y.o. Gender: male  Primary Care Provider: Patient, No Pcp Per Consultants: None Code Status: DNR  Pt Overview and Major Events to Date:  Admitted 7/30  Assessment and Plan: Mr. Lakins is a 29 year old male presenting with hyperglycemia secondary to IDDM.  PMH significant for insulin-dependent diabetes, homelessness, polysubstance abuse, hyponatremia.  Poorly controlled IDDM, improving  DKA/HHS, resolved Transitioned off Endo tool 7/30.  Glucose this a.m. 295 for which he received 20 units of Lantus, 37 units of NovoLog.   We will continue Lantus 20 units twice daily.  Patient is not symptomatic.  WBC WNL.   Ca 8.6. -Follow-up a.m. CBC, CMP -Moderate sliding scale insulin -Lantus 20u BID -Carb modified diet -Follow-up social work consult re PCP/medication/resources -Consult pharmacy about insulin for outpatient -Consult diabetes coordinator team   Sudden bouts of agitation: Per nursing staff, and also witnessed by physicians, patient with occasional severe abrupt bouts of agitation due to various causes (anxiety, desire to smoke cigarettes, wanting to go home to be with his family, concerned that his wife/fiance is missing, itchiness due to poison ivy, etc.).  During these bouts of agitation nursing staff tends to receive brunt of his anger. Per nursing patient responded well to Xanax and verbal redirection. Psychiatry saw patient and recommended continuing gabapentin 400mg  TID for mood and social work consult.   -Patient not IVC'ed. can leave AMA as he chooses -Give Xanax 1 mg twice daily as needed for anxiety, agitation -Social work consulted regarding personal, medication, and housing difficulties -Patient redirectable but only with strong effort  Poison Ivy, diffuse, slightly improved Patient  with diffuse pruritic, erythematous rash to lower extremities, abdomen, genitalia, back, bilateral upper extremities.  History of homelessness, sleeps in the woods.  Rash most likely due to poison ivy.  RMSF less likely due to no fever, headaches, however patient does report recent diarrhea and rash fits RMSF pattern.  -Continue triamcinolone cream -Aluminum sulfate-calcium acetate topical -Hydroxyzine 25 -Consider doxycycline 100 mg twice daily -f/u patient about timetable for rash spread  Current substance abuse  Hx polysub abuse Admission UDS positive for THC only.  History of amphetamines in the past.  Per nurse patient is having diarrhea; however, patient did not recall this during interview in the a.m. -Overnight COWS 4,7 -Continue monitor vital signs -Nicotine patch 21 mg daily - continue to monitor -Speak with nurse in a.m. concerning patient's status, concerned patient may be poor historian   Homelessness Patient has a history of homelessness and reports that he presented to the ED from the woods.He was recently in a home without power or running water with his fiance/wife? He has not been back to that house and has been sleeping in the woods.He has trouble securing insulin regularly and reports that he got this past month insulin after discharge in June.Patient's hospitalizations appear to be avoidable if the patient can secure a regular insulin. -Consult social workas above   FEN/GI: Carb modified PPx: Lovenox  Disposition: Home  Subjective:  Patient had no overnight events.  He  Self-reports constipation eating okay. Per nurse he actually had diarrhea 8/1 and this is charted in his flowsheets. No SOB or chest pain reported.   Objective:   Physical Exam: General: Sleeping in bed, NAD, awakens to verbal cue AO x4.  Less agitated in bed this morning. Cardiovascular: Bradycardiac rate and  regular rhythm no murmur Respiratory: Clear and equal bilaterally no extra work  of breathing noted Abdomen: No pain to palpation, normoactive bowel sounds Extremities: No edema, distal pulses intact Derm: Red maculopapular, with some excoriations, rash covering lower abdomen down to feet bilaterally and below below on arms bilaterally.   Laboratory: Recent Labs  Lab 10/29/19 0234 10/30/19 0802 10/31/19 0312  WBC 6.9 6.9 7.5  HGB 14.9 15.0 15.1  HCT 44.6 44.8 45.3  PLT 380 361 363   Recent Labs  Lab 10/28/19 1031 10/28/19 1319 10/29/19 0234 10/30/19 0802 10/31/19 0312  NA 125*   < > 135 133* 136  K 4.2   < > 3.8 4.4 3.9  CL 92*   < > 101 101 102  CO2 20*   < > 24 23 25   BUN 11   < > 13 15 20   CREATININE 1.16   < > 0.83 0.73 0.69  CALCIUM 7.8*   < > 8.5* 8.7* 8.6*  PROT 5.7*  --   --   --   --   BILITOT 1.2  --   --   --   --   ALKPHOS 149*  --   --   --   --   ALT 37  --   --   --   --   AST 19  --   --   --   --   GLUCOSE 998*   < > 298* 334* 295*   < > = values in this interval not displayed.      Imaging/Diagnostic Tests:   , MD 10/31/2019, 6:59 AM PGY-1, [facility] FPTS Intern pager: [phone], text pages welcome

## 2019-11-01 ENCOUNTER — Telehealth: Payer: Self-pay

## 2019-11-01 MED FILL — !LANTUS 100 UNITS/ML VIAL: 100 | 15 days supply | Qty: 10 | Fill #1

## 2019-11-01 MED FILL — !NOVOLOG 100UNITS/ML VIAL: 100/ML | 28 days supply | Qty: 10 | Fill #1

## 2019-11-01 NOTE — Telephone Encounter (Signed)
Patient returned to the clinic today.  He picked up his insulins from [facility] pharmacy.  He said that he was at the [facility] this morning and is going back there after leaving the clinic. He explained that the [facility] is going to help him get to [place] to see his mother.  He does not have a plan to return to [facility] after that.  Provided him with food and a bus pass and instructed him of the importance of establishing care with a medical provider where he settles and he said that he understood.

## 2019-11-01 NOTE — Discharge Summary (Addendum)
[facility] Discharge Summary  Patient name: Justin [name] Medical record number: [id number] Date of birth: [birth year] Age: 29 y.o. Gender: male Date of Admission: 10/28/2019  Date of Discharge: Patient left AGAINST MEDICAL ADVICE 10/31/2019 Admitting Physician: Dickie La, MD  Primary Care Provider: Patient, No Pcp Per Consultants: None  Indication for Hospitalization: DKA  Problem List:  Poorly controlled IDDM, improving  DKA/HHS Sudden bouts of agitation Poison Ivy Current substance abuse Homelessness  Disposition: Left AGAINST MEDICAL ADVICE, improved from admission  Discharge Exam: Patient left [place], last physical below: General: Sleeping in bed, NAD, awakens to verbal cue AO x4.  Less agitated in bed this morning. Cardiovascular: Bradycardiac rate and regular rhythm no murmur Respiratory: Clear and equal bilaterally no extra work of breathing noted Abdomen: No pain to palpation, normoactive bowel sounds Extremities: No edema, distal pulses intact Derm: Red maculopapular, with some excoriations, rash covering lower abdomen down to feet bilaterally and below below on arms bilaterally.    Brief Hospital Course:  Justin [name] is a 29 year old male who presented with hyperglycemia secondary to IDDM determined to be in DKA vs HHS.  PMH significant for insulin-dependent diabetes, homelessness, polysubstance abuse, hyponatremia.   Poorly controlled IDDM, improving  DKA/HHS, resolved Patient presented to ED with BGL >600 and repeat 998.  BHB at presentation was 3.43.  UA was significant for ketones (20) and glucosuria >500.  Patient reported he ran out of insulin 1 day prior to presentation to ED.  Patient placed on DKA protocol.  Once patient was out of DKA he was transitioned to insulin off the drip and provided diet.  Patient's home diabetic regimen was unclear despite patient .At time of leaving [place] patient was on on 20  units Lantus twice daily.    Sudden bouts of agitation- worsened Patient was noted to be irritated and easily agitated on initial exam.  Patient initially reported SI and required a sitter; however, patient became more agitated with sitter.  Patient denied suicidal ideation and sitter was removed.  Patient had one event of leaving the floor.  Prior to this event patient told nurses he would come back in 20 minutes.  Police had to be called and the team was paged.  Patient returned and reported that he had gone out for smoke.  Team was able to convince patient to try low-dose nicotine patch which was later increased to 21 mg.  Treatment team often had to redirect patient.  Patient did require 2 mg total of Xanax for agitation to which she responded well.  However, patient had an outburst and was seen coming up the hall yelling expletives at nurses and cops had to be called.  Patient left AGAINST MEDICAL ADVICE per nurse notes, patient was later returned to the ED and was yelling about having no clothing, having left him upstairs.  Police had to retrieve clothing from patient room from during hospital stay.     Poison Ivy, diffuse, slightly improved Patient received Atarax and triamcinolone cream.  Patient pruritus appeared to be improving in response to medications however uncertain due to patient's sudden outbursts and leaving [place].    Current substance abuse  Hx polysub abuse UDS at presentation was positive for THC.  Due to history of polysubstance use, confirmed in previous UDSs, patient was placed on COWS.  Scores were low enough throughout hospitalization patient never required treatment.   Homelessness Study was consulted and pharmacy attempted to create an  avenue for patient to get his insulin outpatient.  Patient left AGAINST MEDICAL ADVICE without a set plan for refilling his insulin.   Issues for Follow Up:  Patient left AGAINST MEDICAL ADVICE  Significant Procedures:  None  Significant Labs and Imaging:  Recent Labs  Lab 10/29/19 0234 10/30/19 0802 10/31/19 0312  WBC 6.9 6.9 7.5  HGB 14.9 15.0 15.1  HCT 44.6 44.8 45.3  PLT 380 361 363   Recent Labs  Lab 10/28/19 1031 10/28/19 1319 10/28/19 1545 10/28/19 1545 10/28/19 1829 10/28/19 1829 10/29/19 0234 10/29/19 0234 10/30/19 0802 10/31/19 0312  NA 125*   < > 143  --  136  --  135  --  133* 136  K 4.2   < > 3.8   < > 3.3*   < > 3.8   < > 4.4 3.9  CL 92*   < > 105  --  100  --  101  --  101 102  CO2 20*   < > 27  --  23  --  24  --  23 25  GLUCOSE 998*   < > 65*  --  157*  --  298*  --  334* 295*  BUN 11   < > 8  --  9  --  13  --  15 20  CREATININE 1.16   < > 0.71  --  0.83  --  0.83  --  0.73 0.69  CALCIUM 7.8*   < > 9.0  --  8.7*  --  8.5*  --  8.7* 8.6*  MG  --   --  2.2  --   --   --  2.0  --   --   --   PHOS  --   --  2.6  --   --   --  2.9  --   --   --   ALKPHOS 149*  --   --   --   --   --   --   --   --   --   AST 19  --   --   --   --   --   --   --   --   --   ALT 37  --   --   --   --   --   --   --   --   --   ALBUMIN 3.3*  --   --   --   --   --   --   --   --   --    < > = values in this interval not displayed.      Results/Tests Pending at Time of Discharge: Patient left AGAINST MEDICAL ADVICE  Discharge Medications:  Allergies as of 10/31/2019   No Known Allergies      Medication List     ASK your doctor about these medications    blood glucose meter kit and supplies Kit Dispense based on patient and insurance preference. Use up to four times daily as directed. (FOR ICD-9 250.00, 250.01). For QAC - HS accuchecks.   folic acid 1 MG tablet Commonly known as: FOLVITE Take 1 tablet (1 mg total) by mouth daily.   gabapentin 100 MG capsule Commonly known as: NEURONTIN Take 2 capsules (200 mg total) by mouth 3 (three) times daily.   insulin aspart 100 UNIT/ML injection Commonly known as: novoLOG CBG 70 - 120: 0 units, CBG 121 -  150: 1 unit, CBG 151 - 200: 2  units, CBG 201 - 250: 3 units, CBG 251 - 300: 5 units, CBG 301 - 350: 7 units, CBG 351 - 400: 9 units   Insulin Syringe-Needle U-100 25G X 1" 1 ML Misc For 4 times a day insulin SQ, 1 month supply. Diagnosis E11.65   Lantus 100 UNIT/ML injection Generic drug: insulin glargine Inject 20 Units into the skin in the morning and at bedtime.   nicotine 14 mg/24hr patch Commonly known as: NICODERM CQ - dosed in mg/24 hours Place 1 patch (14 mg total) onto the skin daily.   NovoLIN 70/30 (70-30) 100 UNIT/ML injection Generic drug: insulin NPH-regular Human Inject 25 Units into the skin 2 (two) times daily with a meal.   pantoprazole 40 MG tablet Commonly known as: PROTONIX Take 1 tablet (40 mg total) by mouth daily at 6 (six) AM.        Discharge Instructions: No discharge instructions were able to be provided given patient's abrupt departure from the hospital.   Follow-Up Appointments:  Patient left AGAINST MEDICAL ADVICE  Freida Busman, MD 11/01/2019, 2:19 PM PGY-1, [facility] Upper-Level Resident Addendum    I have discussed the above with the original author and agree with their documentation. My edits for correction/addition/clarification are included. Please see also any attending additions.   Eulis Foster, MD PGY-2, [facility] 11/02/2019 10:35 PM  FPTS Service pager: [phone] (text pages welcome through [place])

## 2019-11-01 NOTE — Hospital Course (Addendum)
Justin [name] is a 29 year old male who presented with hyperglycemia secondary to IDDM determined to be in DKA vs HHS.  PMH significant for insulin-dependent diabetes, homelessness, polysubstance abuse, hyponatremia.   Poorly controlled IDDM Patient presented to ED with BGL >600 and repeat 998.  BHB at presentation was 3.43.  UA was significant for ketones (20) and glucosuria >500.  Patient reported that he ran out of insulin 1 day prior to presentation to ED.  Patient placed on DKA protocol.  Once patient's DKA was resolved, he was transitioned to subcutaneous insulin and the insulin drip was discontinued.  Patient's home diabetic regimen was unclear. At time of his leaving AMA, patient was taking 20 units of Lantus twice daily.    Sudden bouts of agitation- worsened Patient was noted to be irritated and easily agitated on initial exam.  Patient initially reported SI and required a sitter; however, patient became more agitated with sitter.  Patient denied suicidal ideation and sitter was removed.  Patient had one event of leaving the floor.  Prior to this event patient told nurses he would come back in 20 minutes.  Police had to be called and the team was paged.  Patient returned and reported that he had gone out for smoke.  Team was able to convince patient to try low-dose nicotine patch which was later increased to 21 mg.  Treatment team often had to redirect patient.  Patient did require 2 mg total of Xanax for agitation to which he responded well.  However, patient had an episode of agitation and was reported to have been verbally abuse toward staff. Security was called and patient informed staff that he was leaving AMA. Nursing notified primary team after his departure from the floor. Patient later reported to the ED demanding his belongings and left the premises shortly after.     Poison Ivy, diffuse Patient received Atarax and triamcinolone cream.  Patient pruritus appeared to be improving in response  to medications.    Current substance abuse  Hx polysub abuse UDS at presentation was positive for THC.  Due to history of polysubstance use, confirmed in previous urine drug screens, the patient was monitored with COWS.  Scores were low enough throughout hospitalization patient never required treatment.   Homelessness Transitions of Care consulted was placed  and pharmacy attempted to create an avenue for patient to get his insulin as an outpatient at more affordable cost prior to his decision to leave AMA.

## 2021-01-11 IMAGING — CR DG KNEE COMPLETE 4+V*L*
4 series · 4 of 4 positions shown · non-contrast
Comparison: None.

CLINICAL DATA: Fell, laceration

EXAM:
LEFT KNEE - COMPLETE 4+ VIEW

[x knee ap left (1 of 3)]
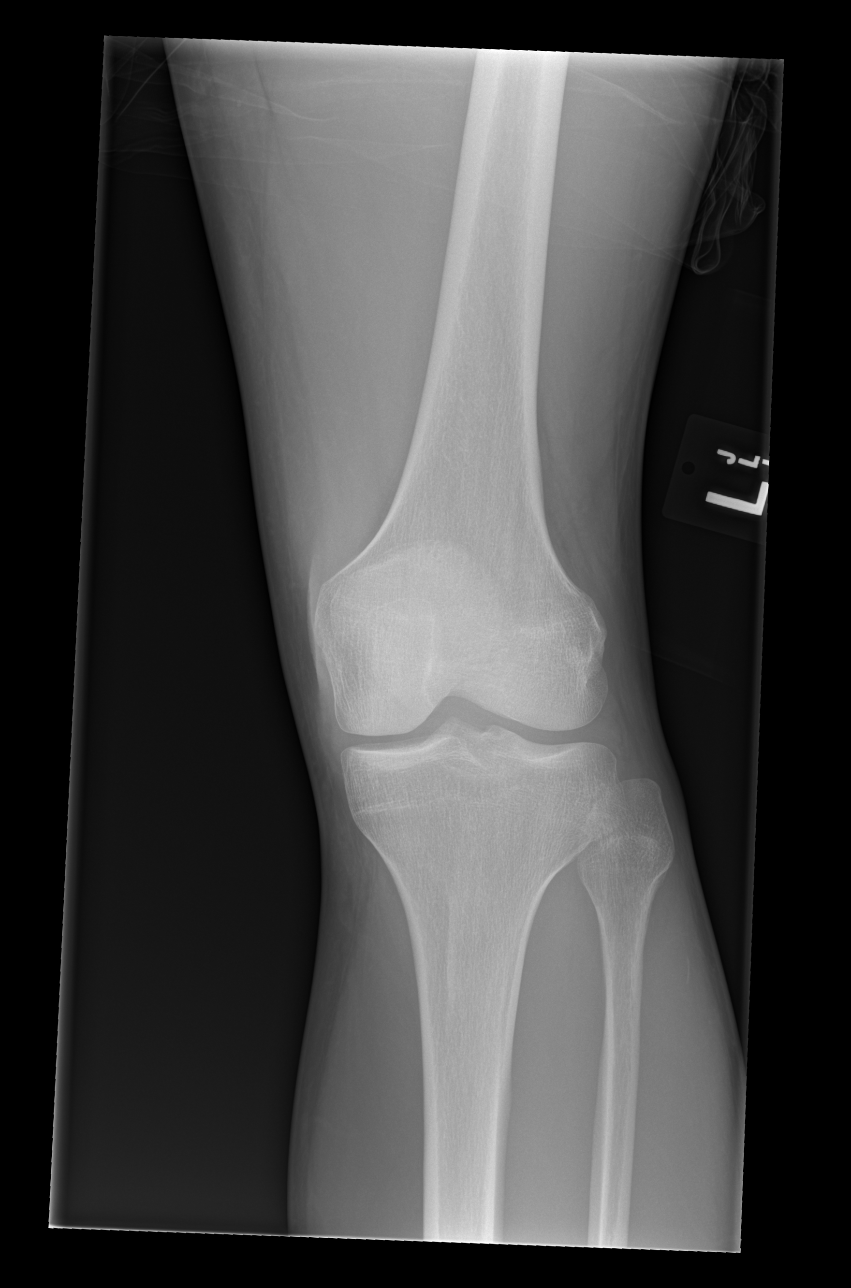

[x knee ap left (2 of 3)]
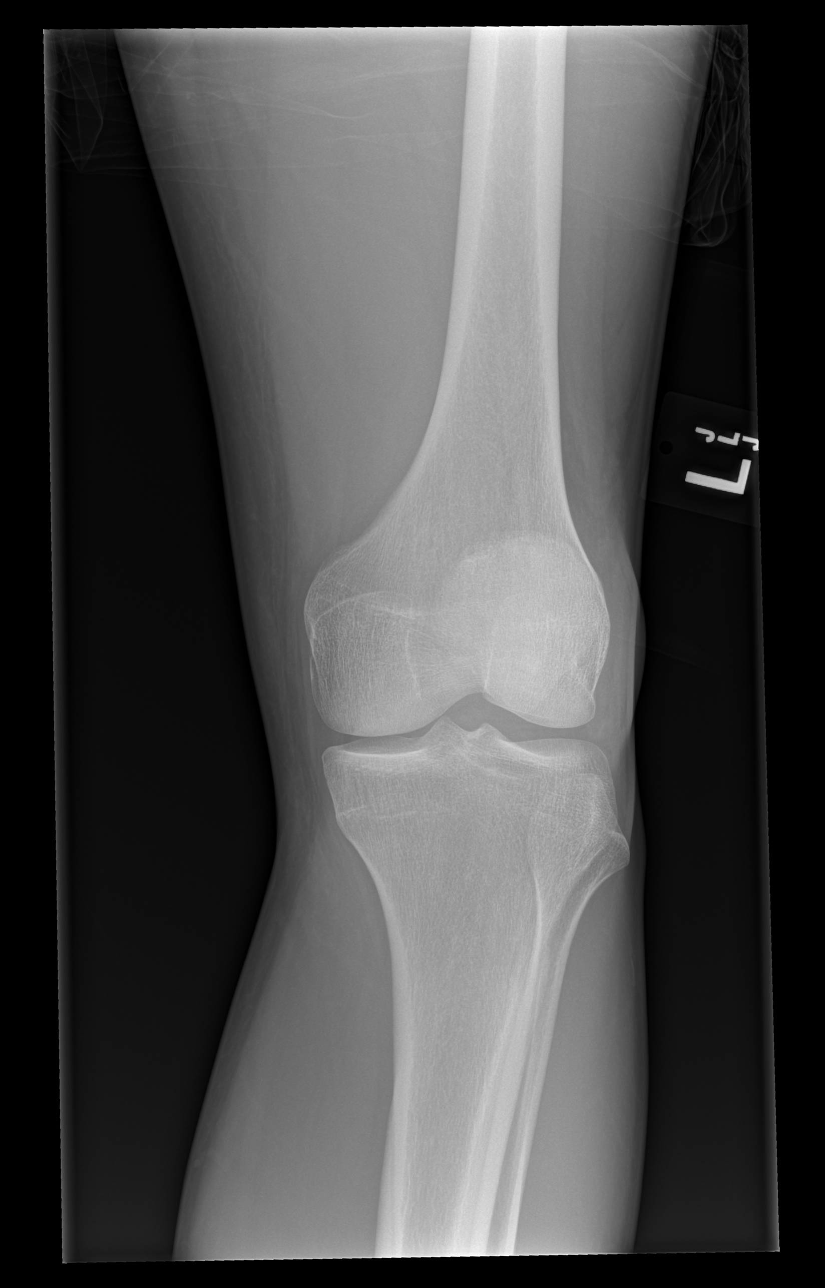

[x knee ap left (3 of 3)]
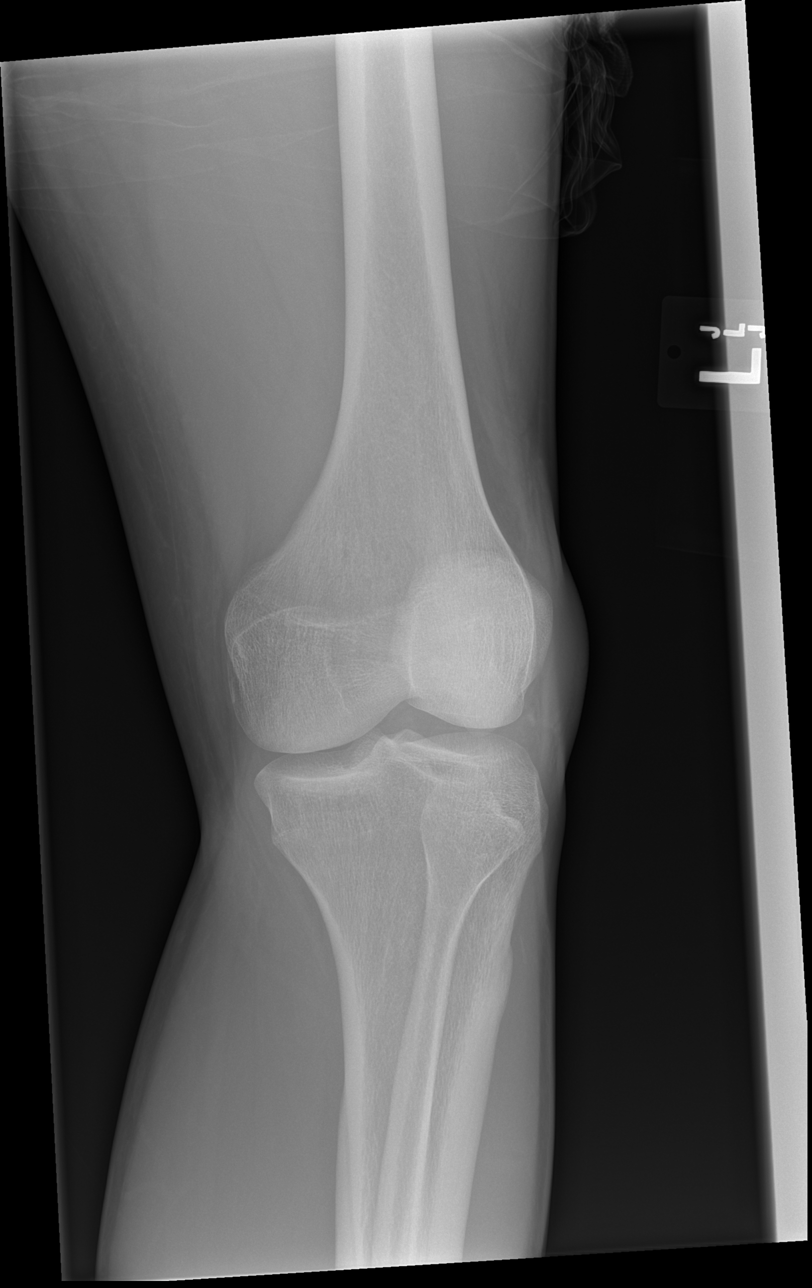

[x knee lat left]
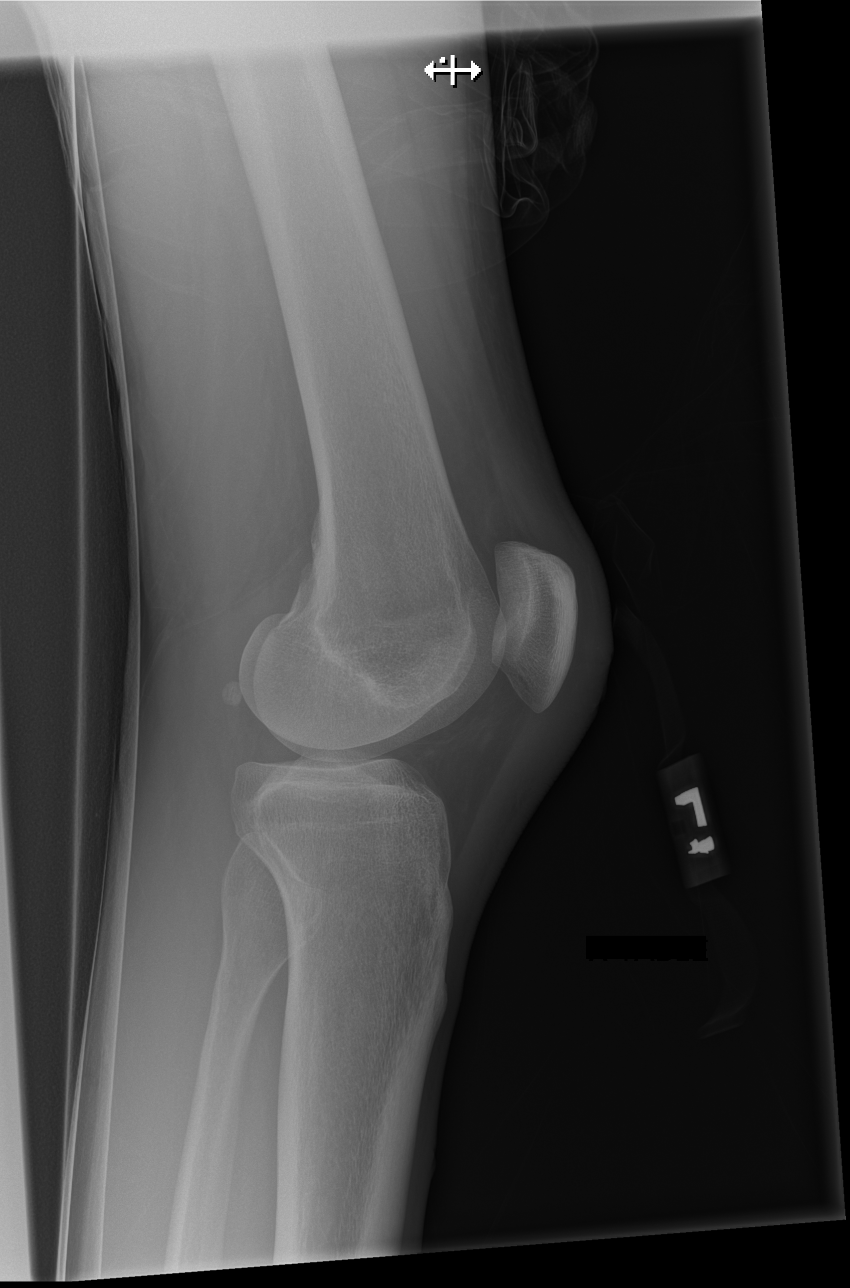

[4 of 4 positions shown; findings below may reference images not displayed]

FINDINGS: Frontal, bilateral oblique, lateral views of the left knee are
obtained. No fracture, subluxation, or dislocation. Joint spaces are
well preserved. No effusion. Mild prepatellar soft tissue swelling.
IMPRESSION: 1. Prepatellar soft tissue swelling.  No acute fracture.
